# Patient Record
Sex: Female | Born: 1965 | Race: White | Hispanic: No | Marital: Married | Smoking: Never smoker
Health system: Southern US, Community
[De-identification: ages and names within clinical notes are randomized; demographics above are authoritative.]

## PROBLEM LIST (undated history)

## (undated) DIAGNOSIS — G4730 Sleep apnea, unspecified: Secondary | ICD-10-CM

## (undated) DIAGNOSIS — E785 Hyperlipidemia, unspecified: Secondary | ICD-10-CM

## (undated) DIAGNOSIS — L509 Urticaria, unspecified: Principal | ICD-10-CM

## (undated) DIAGNOSIS — K219 Gastro-esophageal reflux disease without esophagitis: Secondary | ICD-10-CM

## (undated) DIAGNOSIS — G43909 Migraine, unspecified, not intractable, without status migrainosus: Secondary | ICD-10-CM

## (undated) HISTORY — DX: Gastro-esophageal reflux disease without esophagitis: K21.9

## (undated) HISTORY — PX: WISDOM TOOTH EXTRACTION: SHX21

## (undated) HISTORY — DX: Hyperlipidemia, unspecified: E78.5

## (undated) HISTORY — DX: Urticaria, unspecified: L50.9

## (undated) HISTORY — DX: Sleep apnea, unspecified: G47.30

---

## 2012-11-01 ENCOUNTER — Encounter (HOSPITAL_COMMUNITY): Payer: Self-pay | Admitting: *Deleted

## 2012-11-01 ENCOUNTER — Emergency Department (HOSPITAL_COMMUNITY)
Admission: EM | Admit: 2012-11-01 | Discharge: 2012-11-01 | Disposition: A | Discharge status: Home / Self Care | Payer: BC Managed Care – PPO | Attending: Emergency Medicine | Admitting: Emergency Medicine

## 2012-11-01 ENCOUNTER — Emergency Department (HOSPITAL_COMMUNITY): Payer: BC Managed Care – PPO

## 2012-11-01 DIAGNOSIS — L52 Erythema nodosum: Secondary | ICD-10-CM | POA: Insufficient documentation

## 2012-11-01 DIAGNOSIS — Z8679 Personal history of other diseases of the circulatory system: Secondary | ICD-10-CM | POA: Insufficient documentation

## 2012-11-01 DIAGNOSIS — R110 Nausea: Secondary | ICD-10-CM | POA: Insufficient documentation

## 2012-11-01 DIAGNOSIS — R5381 Other malaise: Secondary | ICD-10-CM | POA: Insufficient documentation

## 2012-11-01 HISTORY — DX: Migraine, unspecified, not intractable, without status migrainosus: G43.909

## 2012-11-01 LAB — CBC WITH DIFFERENTIAL/PLATELET
Basophils Absolute: 0 10*3/uL (ref 0.0–0.1)
Basophils Relative: 0 % (ref 0–1)
Eosinophils Absolute: 0.2 10*3/uL (ref 0.0–0.7)
Eosinophils Relative: 2 % (ref 0–5)
MCH: 29.9 pg (ref 26.0–34.0)
MCV: 89.6 fL (ref 78.0–100.0)
Platelets: 272 10*3/uL (ref 150–400)
RDW: 12.9 % (ref 11.5–15.5)

## 2012-11-01 MED ORDER — ONDANSETRON HCL 4 MG/2ML IJ SOLN
4.0000 mg | Freq: Once | INTRAMUSCULAR | Status: AC
  Administered 2012-11-01: 4 mg via INTRAVENOUS
  Filled 2012-11-01: qty 2

## 2012-11-01 MED ORDER — METHYLPREDNISOLONE 4 MG PO KIT: PACK | ORAL | Status: DC

## 2012-11-01 MED ORDER — SODIUM CHLORIDE 0.9 % IV BOLUS (SEPSIS)
1000.0000 mL | Freq: Once | INTRAVENOUS | Status: AC
  Administered 2012-11-01: 1000 mL via INTRAVENOUS

## 2012-11-01 MED ORDER — HYDROXYZINE HCL 10 MG PO TABS: 10.0000 mg | ORAL_TABLET | Freq: Three times a day (TID) | ORAL | Status: DC | PRN

## 2012-11-01 MED ORDER — METHYLPREDNISOLONE SODIUM SUCC 125 MG IJ SOLR
125.0000 mg | Freq: Once | INTRAMUSCULAR | Status: AC
  Administered 2012-11-01: 125 mg via INTRAVENOUS
  Filled 2012-11-01: qty 2

## 2012-11-01 MED ORDER — EPINEPHRINE 0.3 MG/0.3ML IJ DEVI: 0.3000 mg | INTRAMUSCULAR | Status: AC | PRN

## 2012-11-01 NOTE — ED Notes (Signed)
Pt reports intermittent rash/hives all over her body since November. Has seen her doctor since and was put on prednisone, last dose was last week Wednesday.  Pt reports she started having rash again after.  Pt also reports sudden onset of nausea and h/a less than an hour ago.  Pt denies any abd pain or diarrhea at this time.  Pt denies known fever at present.

## 2012-11-01 NOTE — ED Provider Notes (Signed)
History     CSN: [id number]  Arrival date & time 11/01/12  1228   First MD Initiated Contact with Patient 11/01/12 1245      Chief Complaint  Patient presents with  . Rash  . Nausea  . Weakness     HPI Pt reports intermittent rash/hives all over her body since November. Has seen her doctor since and was put on prednisone, last dose was last week Wednesday. Pt reports she started having rash again after  At times she states the rash is "painful nodules" under her skin that seem to come and go.. Pt also reports sudden onset of nausea and h/a less than an hour ago. Pt denies any abd pain or diarrhea at this time. Pt denies known fever at present.   Past Medical History  Diagnosis Date  . Migraine     History reviewed. No pertinent past surgical history.  No family history on file.  History  Substance Use Topics  . Smoking status: Never Smoker   . Smokeless tobacco: Not on file  . Alcohol Use: Yes     Comment: occasionally    OB History    Grav Para Term Preterm Abortions TAB SAB Ect Mult Living                  Review of Systems  All other systems reviewed and are negative.    Allergies  Codeine; Hydrocodone; and Penicillins  Home Medications  No current outpatient prescriptions on file.  BP 145/76  Pulse 90  Temp 97.8 F (36.6 C) (Oral)  SpO2 100%  Physical Exam  Nursing note and vitals reviewed. Constitutional: She is oriented to person, place, and time. She appears well-developed and well-nourished. No distress.  HENT:  Head: Normocephalic and atraumatic.  Eyes: Pupils are equal, round, and reactive to light.  Neck: Normal range of motion.  Cardiovascular: Normal rate and intact distal pulses.   Pulmonary/Chest: No respiratory distress.  Abdominal: Normal appearance. She exhibits no distension.  Musculoskeletal: Normal range of motion.  Neurological: She is alert and oriented to person, place, and time. No cranial nerve deficit.  Skin: Skin is  warm and dry. Rash noted. No ecchymosis, no lesion and no petechiae noted. Rash is urticarial.  Psychiatric: She has a normal mood and affect. Her behavior is normal.    ED Course  Procedures (including critical care time  Meds ordered this encounter  Medications  . sodium chloride 0.9 % bolus 1,000 mL    Sig:   . ondansetron (ZOFRAN) injection 4 mg    Sig:   . methylPREDNISolone sodium succinate (SOLU-MEDROL) 125 mg/2 mL injection 125 mg    Sig:      Labs Reviewed  CBC WITH DIFFERENTIAL  SEDIMENTATION RATE   No results found.   Erythema Nodosum    MDM         Nelia Shi, MD 11/01/12 2241

## 2013-06-09 ENCOUNTER — Encounter (INDEPENDENT_AMBULATORY_CARE_PROVIDER_SITE_OTHER): Payer: BC Managed Care – PPO | Admitting: Internal Medicine

## 2013-06-09 ENCOUNTER — Encounter: Payer: Self-pay | Admitting: Internal Medicine

## 2013-06-09 DIAGNOSIS — R413 Other amnesia: Secondary | ICD-10-CM

## 2013-06-09 DIAGNOSIS — R21 Rash and other nonspecific skin eruption: Secondary | ICD-10-CM

## 2013-06-09 NOTE — Progress Notes (Signed)
HPI Patinet is a 47 yo history fo migraines.  Takes Verapamil November rash/hives  Jan went to Dr.  Steroids then come back  Sent to allergy Autoimmune Sent to Derm  Autoimmune May had episodes of tingling throughout body  Leaves her with odd felelng Not associated with activity.  Maybe 30 x total. Last time was Aug.    Short term memory effected.    Still has hives.  Gets lumps below skin  Tender  Last a few days. No vision changes.  Breathing is OK  No wheezing.  No SOB   Denies diarrhea.  Patient denies CP.   Last labs LDL was 123, HDL was 73.  (July 2014)  Allergies  Allergen Reactions  . Codeine Nausea And Vomiting  . Eggs Or Egg-Derived Products   . Hydrocodone Nausea And Vomiting  . Penicillins Rash    Current Outpatient Prescriptions  Medication Sig Dispense Refill  . aspirin EC 81 MG tablet Take 81 mg by mouth every morning.      . Calcium Carbonate-Vit D-Min (GNP CALCIUM 1200) 1200-1000 MG-UNIT CHEW Chew by mouth. 1 TAB DAILY      . diphenhydrAMINE (BENADRYL) 25 mg capsule Take 25 mg by mouth every 6 (six) hours as needed.      Marland Kitchen EPINEPHrine (EPIPEN) 0.3 mg/0.3 mL DEVI Inject 0.3 mLs (0.3 mg total) into the muscle as needed.  2 Device  1  . folic acid (FOLVITE) 800 MCG tablet Take 800 mcg by mouth daily.       . furosemide (LASIX) 20 MG tablet PT RARELY TAKES LASIX MAYBE TWICE A YEAR      . hydrOXYzine (ATARAX/VISTARIL) 10 MG tablet Take 1 tablet (10 mg total) by mouth 3 (three) times daily as needed for itching.  30 tablet  0  . Norethindrone-Ethinyl Estradiol-Fe (GENERESS FE) 0.8-25 MG-MCG tablet Chew 1 tablet by mouth daily.      . Omega-3 Fatty Acids (FISH OIL DOUBLE STRENGTH) 1200 MG CAPS Take 1 capsule by mouth daily.      Marland Kitchen pyridOXINE (B-6) 50 MG tablet Take 50 mg by mouth daily.      . ranitidine (ZANTAC) 150 MG tablet Take 150 mg by mouth 2 (two) times daily.      . SUMAtriptan (IMITREX) 100 MG tablet Take 100 mg by mouth every 2 (two) hours as needed for  migraine.      . verapamil (CALAN-SR) 180 MG CR tablet Take 180 mg by mouth at bedtime. For miragrines      . vitamin B-12 (CYANOCOBALAMIN) 1000 MCG tablet Take 1,000 mcg by mouth daily.       No current facility-administered medications for this visit.    Past Medical History  Diagnosis Date  . Migraine     History reviewed. No pertinent past surgical history.  Family History  Problem Relation Age of Onset  . Hypertension Mother   . Heart disease Mother     BYPASS AND  A ABLATION  . Heart attack Mother   . Stroke Mother   . Heart attack Father   . Heart disease Father     OPEN HEART SURGERY/HEART FAILURE/ PACEMAKER AND DEFIB   . Stroke Father   . Heart disease Sister     HEART ATTACK AGE 31/ HE HAS A STENTX1/ CABG X4 VESSELS AT AGE 23 / AT AGE 35 BRAIN STEM STROKE    History   Social History  . Marital Status: Married    Spouse Name: N/A  Number of Children: N/A  . Years of Education: N/A   Occupational History  . Not on file.   Social History Main Topics  . Smoking status: Never Smoker   . Smokeless tobacco: Not on file  . Alcohol Use: Yes     Comment: occasionally  . Drug Use: No  . Sexual Activity: Not on file   Other Topics Concern  . Not on file   Social History Narrative  . No narrative on file    Review of Systems:  All systems reviewed.  They are negative to the above problem except as previously stated.  Vital Signs: BP 147/93  Pulse 82  Ht 5\' 4"  (1.626 m)  Wt 193 lb (87.544 kg)  BMI 33.11 kg/m2  Physical Exam  HEENT:  Normocephalic, atraumatic. EOMI, PERRLA.  Neck: JVP is normal.  No bruits.  Lungs: clear to auscultation. No rales no wheezes.  Heart: Regular rate and rhythm. Normal S1, S2. No S3.   No significant murmurs. PMI not displaced.  Abdomen:  Supple, nontender. Normal bowel sounds. No masses. No hepatomegaly.  Extremities:   Good distal pulses throughout. No lower extremity edema.  Musculoskeletal :moving all extremities.   Neuro:   alert and oriented x3.  CN II-XII grossly intact.  EKG    SR 82 bpm.   Assessment and Plan:  1.  Immune mediated syndrome.  I am not sure of the cause  Seen by allergy  On antihistamines  Still uncomfortable    Will review who she can see for further eval  2.  SHort ter memory problmes.  I am not sure why having problems  I did not do full evaluation.  Not sure if related to 1  Will review wherer she should seek poss eval  3.  Cardiac risk stratification.  Patient's cholesterol panel is pretty good.  If This encounter was created in error - please disregard.

## 2013-08-04 ENCOUNTER — Other Ambulatory Visit: Payer: Self-pay

## 2014-03-03 ENCOUNTER — Encounter: Payer: Self-pay | Admitting: Internal Medicine

## 2014-03-03 NOTE — Progress Notes (Signed)
This encounter was created in error - please disregard.

## 2014-06-02 ENCOUNTER — Telehealth: Payer: Self-pay | Admitting: Internal Medicine

## 2014-06-02 ENCOUNTER — Encounter: Payer: Self-pay | Admitting: Internal Medicine

## 2014-06-02 DIAGNOSIS — L509 Urticaria, unspecified: Secondary | ICD-10-CM

## 2014-06-02 DIAGNOSIS — K9041 Non-celiac gluten sensitivity: Secondary | ICD-10-CM

## 2014-06-02 HISTORY — DX: Urticaria, unspecified: L50.9

## 2014-06-02 NOTE — Telephone Encounter (Signed)
Chronic intermittent rash better gluten free.  She has discussed w/ me and back on gluten. Rash returning.  After she has been on gluten x 2 weeks + will check serology.

## 2014-06-07 ENCOUNTER — Other Ambulatory Visit (INDEPENDENT_AMBULATORY_CARE_PROVIDER_SITE_OTHER): Payer: BC Managed Care – PPO

## 2014-06-07 DIAGNOSIS — K900 Celiac disease: Secondary | ICD-10-CM

## 2014-06-07 DIAGNOSIS — K9041 Non-celiac gluten sensitivity: Secondary | ICD-10-CM

## 2014-06-08 LAB — IGA: IGA: 158 mg/dL (ref 68–378)

## 2014-06-08 LAB — TISSUE TRANSGLUTAMINASE, IGA: TISSUE TRANSGLUTAMINASE AB, IGA: 7.4 U/mL (ref <20)

## 2014-11-02 DIAGNOSIS — R52 Pain, unspecified: Secondary | ICD-10-CM

## 2015-04-16 ENCOUNTER — Ambulatory Visit (AMBULATORY_SURGERY_CENTER): Payer: Self-pay | Admitting: *Deleted

## 2015-04-16 VITALS — Ht 64.5 in | Wt 188.0 lb

## 2015-04-16 DIAGNOSIS — Z1211 Encounter for screening for malignant neoplasm of colon: Secondary | ICD-10-CM

## 2015-04-16 DIAGNOSIS — K219 Gastro-esophageal reflux disease without esophagitis: Secondary | ICD-10-CM

## 2015-04-16 MED ORDER — NA SULFATE-K SULFATE-MG SULF 17.5-3.13-1.6 GM/177ML PO SOLN
ORAL | Status: DC
Start: 2015-04-16 — End: 2015-04-23

## 2015-04-16 NOTE — Progress Notes (Signed)
Patient denies any allergies to soy. Allergy to eggs per patient. Patient denies any past surgeries. Patient denies any oxygen use at home and does not take any diet/weight loss medications. EMMI education assisgned to patient on colonoscopy, this was explained and instructions given to patient.

## 2015-04-23 ENCOUNTER — Ambulatory Visit (AMBULATORY_SURGERY_CENTER): Payer: BLUE CROSS/BLUE SHIELD | Admitting: Internal Medicine

## 2015-04-23 ENCOUNTER — Encounter: Payer: Self-pay | Admitting: Internal Medicine

## 2015-04-23 VITALS — BP 140/72 | HR 79 | Temp 98.6°F | Resp 15 | Ht 64.5 in | Wt 188.0 lb

## 2015-04-23 DIAGNOSIS — Z1211 Encounter for screening for malignant neoplasm of colon: Secondary | ICD-10-CM | POA: Diagnosis not present

## 2015-04-23 DIAGNOSIS — K219 Gastro-esophageal reflux disease without esophagitis: Secondary | ICD-10-CM

## 2015-04-23 MED ORDER — SODIUM CHLORIDE 0.9 % IV SOLN: 500.0000 mL | INTRAVENOUS | Status: DC

## 2015-04-23 NOTE — Progress Notes (Signed)
Report to PACU, RN, vss, BBS= Clear.  

## 2015-04-23 NOTE — Op Note (Signed)
[place]  [address]. [place], [id number]   COLONOSCOPY PROCEDURE REPORT  PATIENT: Lisa Humphrey, Lisa Humphrey  MR#: [id number] BIRTHDATE: Jul 29, 1966 , 49  yrs. old GENDER: female ENDOSCOPIST: Jerene Bears, MD PROCEDURE DATE:  04/23/2015 PROCEDURE:   Colonoscopy, screening First Screening Colonoscopy - Avg.  risk and is 50 yrs.  old or older Yes.  Prior Negative Screening - Now for repeat screening. N/A  History of Adenoma - Now for follow-up colonoscopy & has been > or = to 3 yrs.  N/A  Polyps removed today? No Recommend repeat exam, <10 yrs? No ASA CLASS:   Class II INDICATIONS:Screening for colonic neoplasia. MEDICATIONS: Monitored anesthesia care, Propofol 500 mg IV, and this was the total dose used for all procedures at this session  DESCRIPTION OF PROCEDURE:   After the risks benefits and alternatives of the procedure were thoroughly explained, informed consent was obtained.  The digital rectal exam revealed no rectal mass.   The LB [id number]  endoscope was introduced through the anus and advanced to the terminal ileum which was intubated for a short distance. No adverse events experienced.   The quality of the prep was excellent.  (Suprep was used)  The instrument was then slowly withdrawn as the colon was fully examined. Estimated blood loss is zero unless otherwise noted in this procedure report.      COLON FINDINGS: The examined terminal ileum appeared to be normal. A normal appearing cecum, ileocecal valve, and appendiceal orifice were identified.  The ascending, transverse, descending, sigmoid colon, and rectum appeared unremarkable.  Retroflexed views revealed no abnormalities. The time to cecum = 3.2 Withdrawal time = 12.1   The scope was withdrawn and the procedure completed.  COMPLICATIONS: There were no immediate complications.  ENDOSCOPIC IMPRESSION: 1.   The examined terminal ileum appeared to be normal 2.   Normal  colonoscopy  RECOMMENDATIONS: Repeat colonoscopy 10 years.  eSigned:  Jerene Bears, MD 04/23/2015 3:02 PM   cc:  the patient, Margaretha Sheffield, MD

## 2015-04-23 NOTE — Patient Instructions (Signed)
Discharge instructions given. Normal exam. Handout on a hiatal hernia. Resume previous medications. YOU HAD AN ENDOSCOPIC PROCEDURE TODAY AT [facility]:   Refer to the procedure report that was given to you for any specific questions about what was found during the examination.  If the procedure report does not answer your questions, please call your gastroenterologist to clarify.  If you requested that your care partner not be given the details of your procedure findings, then the procedure report has been included in a sealed envelope for you to review at your convenience later.  YOU SHOULD EXPECT: Some feelings of bloating in the abdomen. Passage of more gas than usual.  Walking can help get rid of the air that was put into your GI tract during the procedure and reduce the bloating. If you had a lower endoscopy (such as a colonoscopy or flexible sigmoidoscopy) you may notice spotting of blood in your stool or on the toilet paper. If you underwent a bowel prep for your procedure, you may not have a normal bowel movement for a few days.  Please Note:  You might notice some irritation and congestion in your nose or some drainage.  This is from the oxygen used during your procedure.  There is no need for concern and it should clear up in a day or so.  SYMPTOMS TO REPORT IMMEDIATELY:   Following lower endoscopy (colonoscopy or flexible sigmoidoscopy):  Excessive amounts of blood in the stool  Significant tenderness or worsening of abdominal pains  Swelling of the abdomen that is new, acute  Fever of 100F or higher   Following upper endoscopy (EGD)  Vomiting of blood or coffee ground material  New chest pain or pain under the shoulder blades  Painful or persistently difficult swallowing  New shortness of breath  Fever of 100F or higher  Black, tarry-looking stools  For urgent or emergent issues, a gastroenterologist can be reached at any hour by calling [phone]  [phone].   DIET: Your first meal following the procedure should be a small meal and then it is ok to progress to your normal diet. Heavy or fried foods are harder to digest and may make you feel nauseous or bloated.  Likewise, meals heavy in dairy and vegetables can increase bloating.  Drink plenty of fluids but you should avoid alcoholic beverages for 24 hours.  ACTIVITY:  You should plan to take it easy for the rest of today and you should NOT DRIVE or use heavy machinery until tomorrow (because of the sedation medicines used during the test).    FOLLOW UP: Our staff will call the number listed on your records the next business day following your procedure to check on you and address any questions or concerns that you may have regarding the information given to you following your procedure. If we do not reach you, we will leave a message.  However, if you are feeling well and you are not experiencing any problems, there is no need to return our call.  We will assume that you have returned to your regular daily activities without incident.  If any biopsies were taken you will be contacted by phone or by letter within the next 1-3 weeks.  Please call us at [phone] if you have not heard about the biopsies in 3 weeks.    SIGNATURES/CONFIDENTIALITY: You and/or your care partner have signed paperwork which will be entered into your electronic medical record.  These signatures attest to the fact  that that the information above on your After Visit Summary has been reviewed and is understood.  Full responsibility of the confidentiality of this discharge information lies with you and/or your care-partner.

## 2015-04-23 NOTE — Op Note (Signed)
[place]  [address]. [place], [id number]   ENDOSCOPY PROCEDURE REPORT  PATIENT: Lisa, Humphrey  MR#: [id number] BIRTHDATE: 1966/05/19 , 49  yrs. old GENDER: female ENDOSCOPIST: Jerene Bears, MD PROCEDURE DATE:  04/23/2015 PROCEDURE:  EGD, diagnostic ASA CLASS:     Class II INDICATIONS:  history of GERD. MEDICATIONS: Monitored anesthesia care and Propofol 300 mg IV TOPICAL ANESTHETIC: none  DESCRIPTION OF PROCEDURE: After the risks benefits and alternatives of the procedure were thoroughly explained, informed consent was obtained.  The LB [id number] endoscope was introduced through the mouth and advanced to the second portion of the duodenum , Without limitations.  The instrument was slowly withdrawn as the mucosa was fully examined.   ESOPHAGUS: The mucosa of the esophagus appeared normal.   Z-line regular at 38 cm.  STOMACH: A 2 cm, sliding type, hiatal hernia was noted.   The mucosa of the stomach appeared normal.  DUODENUM: The duodenal mucosa showed no abnormalities in the bulb and 2nd part of the duodenum.  Retroflexed views revealed a small hiatal hernia.     The scope was then withdrawn from the patient and the procedure completed.  COMPLICATIONS: There were no immediate complications.  ENDOSCOPIC IMPRESSION: 1.   The mucosa of the esophagus appeared normal 2.   2 cm hiatal hernia 3.   The mucosa of the stomach appeared normal 4.   The duodenal mucosa showed no abnormalities in the bulb and 2nd part of the duodenum  RECOMMENDATIONS: 1.  Continue current medications  eSigned:  Jerene Bears, MD 04/23/2015 2:59 PM    CC: the patient, Margaretha Sheffield, MD

## 2015-04-24 ENCOUNTER — Telehealth: Payer: Self-pay | Admitting: *Deleted

## 2015-04-24 NOTE — Telephone Encounter (Signed)
  Follow up Call-  Call back number 04/23/2015  Post procedure Call Back phone  # [phone]  Permission to leave phone message Yes     Patient questions:  Do you have a fever, pain , or abdominal swelling? No. Pain Score  0 *  Have you tolerated food without any problems? Yes.    Have you been able to return to your normal activities? Yes.    Do you have any questions about your discharge instructions: Diet   No. Medications  No. Follow up visit  No  Do you have questions or concerns about your Care? No.  Actions: * If pain score is 4 or above: No action needed, pain <4.

## 2016-06-06 ENCOUNTER — Ambulatory Visit: Payer: BLUE CROSS/BLUE SHIELD | Admitting: Family

## 2016-06-09 NOTE — Progress Notes (Signed)
[facility] [address] [name], [place] [address] Phone: [phone] Subjective:    I'm seeing this patient by the request  of:  POMPOSINI,DANIEL L, MD   CC: Left heel pain   [id number]  Lisa Humphrey is a 50 y.o. female coming in with complaint of left heel pain .  Left heel pain has been going on multiple months. Patient does not remember any injury. Patient states approximately 1 year ago she was having plantar fasciitis. Patient states that this has changed. Patient states now it seems to be worse with activity.    Past Medical History:  Diagnosis Date  . GERD (gastroesophageal reflux disease)   . Migraine   . Sleep apnea    CPAP  . Urticarial rash 06/02/2014   Past Surgical History:  Procedure Laterality Date  . WISDOM TOOTH EXTRACTION     Social History   Social History  . Marital status: Married    Spouse name: N/A  . Number of children: N/A  . Years of education: N/A   Social History Main Topics  . Smoking status: Never Smoker  . Smokeless tobacco: Never Used  . Alcohol use Yes     Comment: occasionally  . Drug use: No  . Sexual activity: Not Asked   Other Topics Concern  . None   Social History Narrative  . None   Allergies  Allergen Reactions  . Codeine Nausea And Vomiting  . Eggs Or Egg-Derived Products Other (See Comments)    "Allergy test was positive years ago, I eat eggs" per pt.  . Hydrocodone Nausea And Vomiting  . Penicillins Rash   Family History  Problem Relation Age of Onset  . Hypertension Mother   . Heart disease Mother     BYPASS AND  A ABLATION  . Heart attack Mother   . Stroke Mother   . Colon polyps Mother   . Diverticulosis Mother   . Heart attack Father   . Heart disease Father     OPEN HEART SURGERY/HEART FAILURE/ PACEMAKER AND DEFIB   . Stroke Father   . Colon polyps Father   . Diverticulosis Father   . Hiatal hernia Father   . Heart disease Sister     HEART ATTACK AGE 82/ HE HAS  A STENTX1/ CABG X4 VESSELS AT AGE 35 / AT AGE 42 BRAIN STEM STROKE  . Colon cancer Neg Hx   . Esophageal cancer Neg Hx   . Rectal cancer Neg Hx   . Stomach cancer Neg Hx   . Colon polyps Brother     Past medical history, social, surgical and family history all reviewed in electronic medical record.  No pertanent information unless stated regarding to the chief complaint.   Review of Systems: No headache, visual changes, nausea, vomiting, diarrhea, constipation, dizziness, abdominal pain, skin rash, fevers, chills, night sweats, weight loss, swollen lymph nodes, body aches, joint swelling, muscle aches, chest pain, shortness of breath, mood changes.   Objective  Blood pressure 134/78, pulse 76, weight 182 lb (82.6 kg), SpO2 98 %.  General: No apparent distress alert and oriented x3 mood and affect normal, dressed appropriately.  HEENT: Pupils equal, extraocular movements intact  Respiratory: Patient's speak in full sentences and does not appear short of breath  Cardiovascular: No lower extremity edema, non tender, no erythema  Skin: Warm dry intact with no signs of infection or rash on extremities or on axial skeleton.  Abdomen: Soft nontender  Neuro: Cranial  nerves II through XII are intact, neurovascularly intact in all extremities with 2+ DTRs and 2+ pulses.  Lymph: No lymphadenopathy of posterior or anterior cervical chain or axillae bilaterally.  Gait Mild antalgic gait MSK:  Non tender with full range of motion and good stability and symmetric strength and tone of shoulders, elbows, wrist, hip, knee bilaterally.   Ankle: Left No visible erythema or swelling. Range of motion is full in all directions. Strength is 5/5 in all directions. Stable lateral and medial ligaments; squeeze test and kleiger test unremarkable; Talar dome nontender; No pain at base of 5th MT; No tenderness over cuboid; No tenderness over N spot or navicular prominence No tenderness on posterior aspects of  lateral and medial malleolus No sign of peroneal tendon subluxations or tenderness to palpation Negative tarsal tunnel tinel's Able to walk 4 steps does have antalgic gait Patient has tenderness to palpation on the plantar aspect of the heel. Patient also has some mild pain on the plantar aspect of the midfoot. Otherwise unremarkable.  MSK US performed of: Left foot and ankle This study was ordered, performed, and interpreted by Charlann Boxer D.O.  Foot/Ankle:   Plantar aspect of the foot was visualized. Patient has what appears to be calcific changes at the insertion or origin of the plantar fascia. Likely second due to scar tissue formation. Patient does have some mild intersubstance tearing of the plantar fascia. 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      Note: This dictation was prepared with Dragon dictation along with smaller phrase technology. Any transcriptional errors that result from this process are unintentional.

## 2016-06-10 ENCOUNTER — Encounter: Payer: Self-pay | Admitting: Family Medicine

## 2016-06-10 ENCOUNTER — Ambulatory Visit (INDEPENDENT_AMBULATORY_CARE_PROVIDER_SITE_OTHER): Payer: BLUE CROSS/BLUE SHIELD | Admitting: Family Medicine

## 2016-06-10 ENCOUNTER — Other Ambulatory Visit: Payer: Self-pay

## 2016-06-10 VITALS — BP 134/78 | HR 76 | Wt 182.0 lb

## 2016-06-10 DIAGNOSIS — M79672 Pain in left foot: Secondary | ICD-10-CM

## 2016-06-10 DIAGNOSIS — D2122 Benign neoplasm of connective and other soft tissue of left lower limb, including hip: Secondary | ICD-10-CM

## 2016-06-10 MED ORDER — MELOXICAM 15 MG PO TABS: 15.0000 mg | ORAL_TABLET | Freq: Every day | ORAL | 0 refills | Status: DC

## 2016-06-10 MED ORDER — VITAMIN D (ERGOCALCIFEROL) 1.25 MG (50000 UNIT) PO CAPS: 50000.0000 [IU] | ORAL_CAPSULE | ORAL | 0 refills | Status: DC

## 2016-06-10 NOTE — Patient Instructions (Addendum)
Good to see you  Ice 20 minutes 2 times daily. Usually after activity and before bed. Wear boot with walking.  Meloxicam daily for 30 days stop if it hurts your stomach  Once weekly vitamin D for next 12 weeks.  Come out of boot and move it a couple times a day not weight bearing You have a tear of the plantar fascia with calcific changes and then a fibroma See me again in 4 weeks and we will then discuss shoes.

## 2016-06-10 NOTE — Assessment & Plan Note (Signed)
Patient does have some calcific deposits at the origin of the plantar fascia. In addition of this toe patient does have a fibroma. This is the area where patient seems to be the most tender. Discussed with patient at great length. Patient will try once weekly vitamin D, Cam Walker, icing protocol. Patient given oral anti-inflammatories ankle be beneficial. We will see how patient response to conservative therapy. Follow-up in 4 weeks to make sure that it could be decreasing in size. If worsening symptoms or no improvement we can consider injection.

## 2016-06-13 ENCOUNTER — Ambulatory Visit: Payer: BLUE CROSS/BLUE SHIELD | Admitting: Family

## 2016-06-24 ENCOUNTER — Ambulatory Visit: Payer: BLUE CROSS/BLUE SHIELD | Admitting: Family Medicine

## 2016-07-06 ENCOUNTER — Other Ambulatory Visit: Payer: Self-pay | Admitting: Family Medicine

## 2016-07-07 NOTE — Telephone Encounter (Signed)
Refill done.  

## 2016-07-08 NOTE — Progress Notes (Signed)
[name] [place] [place], [place] [address] Phone: [phone] Subjective:    I'm seeing this patient by the request  of:  POMPOSINI,DANIEL L, MD   CC: Left heel pain  F/u  [id number]  Lisa Humphrey is a 50 y.o. female coming in with complaint of left heel pain .  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 Seen 4 weeks ago. Patient was to start vitamin D, Cam Walker as well as an icing protocol. Given oral anti-inflammatories. Patient states Mild improvement. States that she continues to have difficulty. Patient states that if she comes out of the [employer] she has worsening pain. Especially if she puts pressure on the heel itself.    Past Medical History:  Diagnosis Date  . GERD (gastroesophageal reflux disease)   . Migraine   . Sleep apnea    CPAP  . Urticarial rash 06/02/2014   Past Surgical History:  Procedure Laterality Date  . WISDOM TOOTH EXTRACTION     Social History   Social History  . Marital status: Married    Spouse name: N/A  . Number of children: N/A  . Years of education: N/A   Social History Main Topics  . Smoking status: Never Smoker  . Smokeless tobacco: Never Used  . Alcohol use Yes     Comment: occasionally  . Drug use: No  . Sexual activity: Not Asked   Other Topics Concern  . None   Social History Narrative  . None   Allergies  Allergen Reactions  . Codeine Nausea And Vomiting  . Eggs Or Egg-Derived Products Other (See Comments)    "Allergy test was positive years ago, I eat eggs" per pt.  . Hydrocodone Nausea And Vomiting  . Penicillins Rash   Family History  Problem Relation Age of Onset  . Hypertension Mother   . Heart disease Mother     BYPASS AND  A ABLATION  . Heart attack Mother   . Stroke Mother   . Colon polyps Mother   . Diverticulosis Mother   . Heart attack Father   . Heart disease Father     OPEN HEART SURGERY/HEART FAILURE/ PACEMAKER AND DEFIB   .  Stroke Father   . Colon polyps Father   . Diverticulosis Father   . Hiatal hernia Father   . Heart disease Sister     HEART ATTACK AGE 40/ HE HAS A STENTX1/ CABG X4 VESSELS AT AGE 48 / AT AGE 46 BRAIN STEM STROKE  . Colon cancer Neg Hx   . Esophageal cancer Neg Hx   . Rectal cancer Neg Hx   . Stomach cancer Neg Hx   . Colon polyps Brother     Past medical history, social, surgical and family history all reviewed in electronic medical record.  No pertanent information unless stated regarding to the chief complaint.   Review of Systems: No headache, visual changes, nausea, vomiting, diarrhea, constipation, dizziness, abdominal pain, skin rash, fevers, chills, night sweats, weight loss, swollen lymph nodes, body aches, joint swelling, muscle aches, chest pain, shortness of breath, mood changes.   Objective  Blood pressure 122/80, pulse 88, SpO2 98 %.  General: No apparent distress alert and oriented x3 mood and affect normal, dressed appropriately.  HEENT: Pupils equal, extraocular movements intact  Respiratory: Patient's speak in full sentences and does not appear short of breath  Cardiovascular: No lower extremity edema, non tender, no erythema  Skin: Warm dry intact with no signs of infection or rash on extremities or on axial skeleton.  Abdomen: Soft nontender  Neuro: Cranial nerves II through XII are intact, neurovascularly intact in all extremities with 2+ DTRs and 2+ pulses.  Lymph: No lymphadenopathy of posterior or anterior cervical chain or axillae bilaterally.  Gait Mild antalgic gait Still present. Favoring The left heel MSK:  Non tender with full range of motion and good stability and symmetric strength and tone of shoulders, elbows, wrist, hip, knee bilaterally.   Ankle: Left No visible erythema or swelling. Range of motion is full in all directions. Strength is 5/5 in all directions. Stable lateral and medial ligaments; squeeze test and kleiger test unremarkable; Talar  dome nontender; No pain at base of 5th MT; No tenderness over cuboid; No tenderness over N spot or navicular prominence No tenderness on posterior aspects of lateral and medial malleolus No sign of peroneal tendon subluxations or tenderness to palpation Negative tarsal tunnel tinel's Able to walk 4 steps does have antalgic gait Patient has tenderness to palpation on the plantar aspect of the heel. Patient also has some mild pain on the plantar aspect of the midfoot. Otherwise unremarkable.  MSK US performed of: Left foot and ankle This study was ordered, performed, and interpreted by Charlann Boxer D.O.  Foot/Ankle:   Plantar aspect of the foot was visualized. Plantar fascial has significant decrease in the calcifications but does have some intersubstance tearing still noted. Fibroma still noted    IMPRESSION:  Plantar fasciitis tear with decrease in calcific changes and continued fibroma  Procedure: Real-time Ultrasound Guided Injection of fibroma Device: GE Logiq E  Ultrasound guided injection is preferred based studies that show increased duration, increased effect, greater accuracy, decreased procedural pain, increased response rate, and decreased cost with ultrasound guided versus blind injection.  Verbal informed consent obtained.  Time-out conducted.  Noted no overlying erythema, induration, or other signs of local infection.  Skin prepped in a sterile fashion.  Local anesthesia: Topical Ethyl chloride.  With sterile technique and under real time ultrasound guidance:  With a 25-gauge 1 inch needle patient was injected with a total of 0.5 mL of 0.5% Marcaine and 0.5 mL of Kenalog 40 mg/dL. Completed without difficulty  Pain immediately resolved suggesting accurate placement of the medication.  Advised to call if fevers/chills, erythema, induration, drainage, or persistent bleeding.  Images permanently stored and available for review in the ultrasound unit.  Impression: Technically  successful ultrasound guided injection.    Impression and Recommendations:     This case required medical decision making of moderate complexity.      Note: This dictation was prepared with Dragon dictation along with smaller phrase technology. Any transcriptional errors that result from this process are unintentional.

## 2016-07-09 ENCOUNTER — Other Ambulatory Visit: Payer: Self-pay

## 2016-07-09 ENCOUNTER — Encounter: Payer: Self-pay | Admitting: Family Medicine

## 2016-07-09 ENCOUNTER — Ambulatory Visit (INDEPENDENT_AMBULATORY_CARE_PROVIDER_SITE_OTHER): Payer: BLUE CROSS/BLUE SHIELD | Admitting: Family Medicine

## 2016-07-09 VITALS — BP 122/80 | HR 88

## 2016-07-09 DIAGNOSIS — D2122 Benign neoplasm of connective and other soft tissue of left lower limb, including hip: Secondary | ICD-10-CM

## 2016-07-09 DIAGNOSIS — M79672 Pain in left foot: Secondary | ICD-10-CM

## 2016-07-09 NOTE — Assessment & Plan Note (Signed)
No improvement to worsening symptoms. Tolerated the injection well. Patient did have some improvement in pain. Continue Cam Walker for one more week. We discussed icing regimen, will be fitted for custom orthotics, discussed shoes again in greater detail. Follow-up again in 4 weeks otherwise.

## 2016-07-09 NOTE — Patient Instructions (Signed)
Good to see you  We injected fibroma.  Ice is your friend Refilled the meloxicam  We will call you when we get the orthotic in.  If it starts to feel better come out of the boot in 1 week.  See me again in 4 weeks.

## 2016-07-14 ENCOUNTER — Encounter: Payer: Self-pay | Admitting: Family Medicine

## 2016-07-14 ENCOUNTER — Ambulatory Visit (INDEPENDENT_AMBULATORY_CARE_PROVIDER_SITE_OTHER): Payer: BLUE CROSS/BLUE SHIELD | Admitting: Family Medicine

## 2016-07-14 DIAGNOSIS — D2122 Benign neoplasm of connective and other soft tissue of left lower limb, including hip: Secondary | ICD-10-CM

## 2016-07-14 NOTE — Patient Instructions (Signed)
Good to see you Increase the wear of the orthotic slowly.  Start 2 hours first day and increase 1 hour a day until a full day.  Can take up to 3 weeks to be comfortable with them.  See Dr. Tamala Julian again in 3-4 weeks.

## 2016-07-14 NOTE — Assessment & Plan Note (Signed)
Patient was fitted in orthotics today. We discussed slowly increasing wear over the course of time. We discussed what adjustments may be need to be needed. Patient will call if has any difficulty. Follow-up again in 2-4 weeks.

## 2016-07-14 NOTE — Progress Notes (Signed)
  [name] [address] [place], [place] [address] Phone: [phone] Subjective:    I'm seeing this patient by the request  of:  POMPOSINI,DANIEL L, MD   CC: Left heel pain  F/u  [id number]  Gertis Sory is a 51 y.o. female coming in with complaint of left heel pain .  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  Given an injection in the fibroma itself. Patient has been having difficulty even do feel that patient needs custom orthotics. Patient is here to be fitted today.    Procedure Note   Patient was fitted for a : standard, cushioned, semi-rigid orthotic. The orthotic was heated and afterward the patient patient seated position and molded The patient was positioned in subtalar neutral position and 10 degrees of ankle dorsiflexion in a weight bearing stance. After completion of molding, patient did have orthotic management The blank was ground to a stable position for weight bearing. Size: 8.5 Base: Carbon fiber Additional Posting and Padding: Patient is longitudinal arch bilaterally of the 300/100 The patient ambulated these, and they were very comfortable. I spent 45 minutes with this patient, greater than 50% was face-to-face time counseling regarding the below diagnosis.    Impression and Recommendations:     This case required medical decision making of moderate complexity.      Note: This dictation was prepared with Dragon dictation along with smaller phrase technology. Any transcriptional errors that result from this process are unintentional.

## 2016-07-17 ENCOUNTER — Other Ambulatory Visit: Payer: BLUE CROSS/BLUE SHIELD

## 2016-07-17 DIAGNOSIS — M79672 Pain in left foot: Secondary | ICD-10-CM

## 2016-08-05 NOTE — Progress Notes (Signed)
[name] [place], [place] [address] Phone: [phone] Subjective:      CC: Left heel pain  F/u  [id number]  Lisa Humphrey is a 50 y.o. female coming in with complaint of left heel pain .  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  Given an injection in the fibroma itself. Also having plantar fasciitis. Patient was putting custom orthotics. Patient states She is doing much better. Feels that the orthotics at the very helpful. Still has some mild soreness of the first steps in the morning and at the end of a long day but nothing as severe as what it was. Mild numbness noted on the lateral aspect of the foot. Patient states that the ball of her foot and is no longer having the pain though.   Past Medical History:  Diagnosis Date  . GERD (gastroesophageal reflux disease)   . Migraine   . Sleep apnea    CPAP  . Urticarial rash 06/02/2014   Past Surgical History:  Procedure Laterality Date  . WISDOM TOOTH EXTRACTION     Social History  Substance Use Topics  . Smoking status: Never Smoker  . Smokeless tobacco: Never Used  . Alcohol use Yes     Comment: occasionally   Allergies  Allergen Reactions  . Codeine Nausea And Vomiting  . Eggs Or Egg-Derived Products Other (See Comments)    "Allergy test was positive years ago, I eat eggs" per pt.  . Hydrocodone Nausea And Vomiting  . Penicillins Rash   Family History  Problem Relation Age of Onset  . Hypertension Mother   . Heart disease Mother     BYPASS AND  A ABLATION  . Heart attack Mother   . Stroke Mother   . Colon polyps Mother   . Diverticulosis Mother   . Heart attack Father   . Heart disease Father     OPEN HEART SURGERY/HEART FAILURE/ PACEMAKER AND DEFIB   . Stroke Father   . Colon polyps Father   . Diverticulosis Father   . Hiatal hernia Father   . Heart disease Sister     HEART ATTACK AGE 51/ HE HAS A STENTX1/ CABG X4 VESSELS AT AGE 44 /  AT AGE 65 BRAIN STEM STROKE  . Colon cancer Neg Hx   . Esophageal cancer Neg Hx   . Rectal cancer Neg Hx   . Stomach cancer Neg Hx   . Colon polyps Brother    Blood pressure (!) 150/80, pulse 84, height 5\' 4"  (1.626 m), weight 186 lb 9.6 oz (84.6 kg).   Systems examined below as of 08/06/16 General: NAD A&O x3 mood, affect normal  HEENT: Pupils equal, extraocular movements intact no nystagmus Respiratory: not short of breath at rest or with speaking Cardiovascular: No lower extremity edema, non tender Skin: Warm dry intact with no signs of infection or rash on extremities or on axial skeleton. Abdomen: Soft nontender, no masses Neuro: Cranial nerves  intact, neurovascularly intact in all extremities with 2+ DTRs and 2+ pulses. Lymph: No lymphadenopathy appreciated today  Gait normal with good balance and coordination.  MSK: Non tender with full range of motion and good stability and symmetric strength and tone of shoulders, elbows, wrist,  knee hips and ankles bilaterally.  Foot exam shows patient is some mild overpronation of the hindfoot. Mild breakdown of the transverse and longitudinal arch. Mild discomfort at the medial calcaneal region as  well as the lateral calcaneal region.      Impression and Recommendations:     This case required medical decision making of moderate complexity.      Note: This dictation was prepared with Dragon dictation along with smaller phrase technology. Any transcriptional errors that result from this process are unintentional.

## 2016-08-06 ENCOUNTER — Encounter: Payer: Self-pay | Admitting: Family Medicine

## 2016-08-06 ENCOUNTER — Ambulatory Visit (INDEPENDENT_AMBULATORY_CARE_PROVIDER_SITE_OTHER): Payer: BLUE CROSS/BLUE SHIELD | Admitting: Family Medicine

## 2016-08-06 DIAGNOSIS — D2122 Benign neoplasm of connective and other soft tissue of left lower limb, including hip: Secondary | ICD-10-CM | POA: Diagnosis not present

## 2016-08-06 NOTE — Patient Instructions (Addendum)
Good to see you  I am so glad the orthotics are working.  You are walking great  The fibroma from time to time will get a little angry but I really hope with the orthotics this should take years to get better.  Unfortunately I would avoid being barefoot.  Continue the turmeric as needed  Vitamin D 4000 IU daily for 2 weeks then 2000 IU daily thereafter See me again before the end of the year and we can inject if we need.

## 2016-08-06 NOTE — Assessment & Plan Note (Signed)
Much improved after the injection. Patient is doing home exercises, encouraged patient continue with vitamin D supplementation. Patient will do custom orthotics daily. We discussed which activities to regularly. Follow-up again in 6 weeks.

## 2016-08-07 ENCOUNTER — Other Ambulatory Visit: Payer: Self-pay | Admitting: Family Medicine

## 2016-08-07 NOTE — Telephone Encounter (Signed)
Refill done.  

## 2016-09-24 NOTE — Progress Notes (Signed)
Lisa Humphrey, Lisa Humphrey Subjective:      CC: Left heel pain  F/u  New problem neck pain [id number]  Lisa Humphrey is a 50 y.o. female coming in with complaint of left heel pain .  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  Patient is doing much better with custom orthotics and was given an injection greater than 3 months ago. Patient states still doing very well. No significant pain at all. Patient was having a new prone. Neck pain. States that it seems to be on the right side. Mild dull, throbbing aching pain radiating towards the right shoulder. Denies any weakness. Can wake her up at night. Does not remember any true injury. Has had some audible popping recently. States that the popping feels better. Still able to do daily activities but states that it seems to be more painful and taking anti-inflammatories on a more regular basis.   Past Medical History:  Diagnosis Date  . GERD (gastroesophageal reflux disease)   . Migraine   . Sleep apnea    CPAP  . Urticarial rash 06/02/2014   Past Surgical History:  Procedure Laterality Date  . WISDOM TOOTH EXTRACTION     Social History  Substance Use Topics  . Smoking status: Never Smoker  . Smokeless tobacco: Never Used  . Alcohol use Yes     Comment: occasionally   Allergies  Allergen Reactions  . Codeine Nausea And Vomiting  . Eggs Or Egg-Derived Products Other (See Comments)    "Allergy test was positive years ago, I eat eggs" per pt.  . Hydrocodone Nausea And Vomiting  . Penicillins Rash   Family History  Problem Relation Age of Onset  . Hypertension Mother   . Heart disease Mother     BYPASS AND  A ABLATION  . Heart attack Mother   . Stroke Mother   . Colon polyps Mother   . Diverticulosis Mother   . Heart attack Father   . Heart disease Father     OPEN HEART SURGERY/HEART FAILURE/ PACEMAKER AND DEFIB   . Stroke  Father   . Colon polyps Father   . Diverticulosis Father   . Hiatal hernia Father   . Heart disease Sister     HEART ATTACK AGE 1/ HE HAS A STENTX1/ CABG X4 VESSELS AT AGE 92 / AT AGE 57 BRAIN STEM STROKE  . Colon cancer Neg Hx   . Esophageal cancer Neg Hx   . Rectal cancer Neg Hx   . Stomach cancer Neg Hx   . Colon polyps Brother     Review of Systems: No headache, visual changes, nausea, vomiting, diarrhea, constipation, dizziness, abdominal pain, skin rash, fevers, chills, night sweats, weight loss, swollen lymph nodes, body aches, joint swelling, muscle aches, chest pain, shortness of breath, mood changes.     Blood pressure 130/80, pulse 84, SpO2 96 %.  Systems examined below as of 09/25/16 General: NAD A&O x3 mood, affect normal  HEENT: Pupils equal, extraocular movements intact no nystagmus Respiratory: not short of breath at rest or with speaking Cardiovascular: No lower extremity edema, non tender Skin: Warm dry intact with no signs of infection or rash on extremities or on axial skeleton. Abdomen: Soft nontender, no masses Neuro: Cranial nerves  intact, neurovascularly intact in all extremities with 2+ DTRs and 2+ pulses. Lymph: No lymphadenopathy appreciated today  Gait normal with good  balance and coordination.  MSK: Non tender with full range of motion and good stability and symmetric strength and tone of shoulders, elbows, wrist,  knee hips and ankles bilaterally.   Foot exam shows patient is some mild overpronation of the hindfoot. Mild breakdown of the transverse and longitudinal arch. No tenderness on exam today.  Neck: Inspection unremarkable. No palpable stepoffs. Negative Spurling's maneuver. Tenderness noted on the right side lacking the last 5 of side bending than the last 5 of left-sided rotation Grip strength and sensation normal in bilateral hands Strength good C4 to T1 distribution No sensory change to C4 to T1 Negative Hoffman sign  bilaterally Reflexes normal  Osteopathic findings Cervical C2 flexed rotated and side bent right C4 flexed rotated and side bent left T3 extended rotated and side bent right inhaled third rib T9 extended rotated and side bent left     Impression and Recommendations:     This case required medical decision making of moderate complexity.      Note: This dictation was prepared with Dragon dictation along with smaller phrase technology. Any transcriptional errors that result from this process are unintentional.

## 2016-09-25 ENCOUNTER — Ambulatory Visit (INDEPENDENT_AMBULATORY_CARE_PROVIDER_SITE_OTHER)
Admission: RE | Admit: 2016-09-25 | Discharge: 2016-09-25 | Disposition: A | Discharge status: Home / Self Care | Payer: BLUE CROSS/BLUE SHIELD | Source: Ambulatory Visit | Attending: Family Medicine | Admitting: Family Medicine

## 2016-09-25 ENCOUNTER — Ambulatory Visit (INDEPENDENT_AMBULATORY_CARE_PROVIDER_SITE_OTHER): Payer: BLUE CROSS/BLUE SHIELD | Admitting: Family Medicine

## 2016-09-25 ENCOUNTER — Encounter: Payer: Self-pay | Admitting: Family Medicine

## 2016-09-25 VITALS — BP 130/80 | HR 84

## 2016-09-25 DIAGNOSIS — M542 Cervicalgia: Secondary | ICD-10-CM | POA: Insufficient documentation

## 2016-09-25 DIAGNOSIS — M999 Biomechanical lesion, unspecified: Secondary | ICD-10-CM | POA: Insufficient documentation

## 2016-09-25 DIAGNOSIS — D2122 Benign neoplasm of connective and other soft tissue of left lower limb, including hip: Secondary | ICD-10-CM

## 2016-09-25 DIAGNOSIS — M5010 Cervical disc disorder with radiculopathy, unspecified cervical region: Secondary | ICD-10-CM

## 2016-09-25 MED ORDER — PREDNISONE 50 MG PO TABS: 50.0000 mg | ORAL_TABLET | Freq: Every day | ORAL | 0 refills | Status: DC

## 2016-09-25 MED ORDER — GABAPENTIN 100 MG PO CAPS: 200.0000 mg | ORAL_CAPSULE | Freq: Every day | ORAL | 3 refills | Status: DC

## 2016-09-25 NOTE — Assessment & Plan Note (Signed)
Patient is having neck pain. Likely multifactorial be mostly muscle imbalances and poor posture. We discussed ergonomics at work. Responded well to osteopathic manipulation today. X-rays are pending. No signs of positive Spurling's today but patient does describe radicular symptoms though. Patient shoulder though seems to be unremarkable. Thoracic outlet syndrome was within the differential. We'll see how patient response to conservative therapy including home exercises and follow-up again in 3-4 weeks.

## 2016-09-25 NOTE — Assessment & Plan Note (Signed)
Decision today to treat with OMT was based on Physical Exam  After verbal consent patient was treated with HVLA, ME, FPR techniques in cervical, thoracic and rib areas  Patient tolerated the procedure well with improvement in symptoms  Patient given exercises, stretches and lifestyle modifications  See medications in patient instructions if given  Patient will follow up in 3-4 weeks      

## 2016-09-25 NOTE — Patient Instructions (Addendum)
Great to see you  Ice can still help  I think the custom orthotics will continue to help.  Gabapentin 200mg  at night Exercises 3 times a week.  If worsening then do prednisone daily for 5 days.  You responded well to manipulation  See me again in 3-4 weeks  Happy New Year!

## 2016-09-25 NOTE — Assessment & Plan Note (Signed)
Significant improvement. Doing very well overall. We discussed icing regimen and home exercises. Patient will continue with the custom orthotics. Follow-up as needed for this problem.

## 2016-10-16 ENCOUNTER — Ambulatory Visit: Payer: BLUE CROSS/BLUE SHIELD | Admitting: Family Medicine

## 2016-10-16 ENCOUNTER — Other Ambulatory Visit: Payer: Self-pay | Admitting: Family Medicine

## 2016-10-17 NOTE — Telephone Encounter (Signed)
Refill done.  

## 2016-10-28 ENCOUNTER — Encounter: Payer: Self-pay | Admitting: Family Medicine

## 2016-10-28 ENCOUNTER — Ambulatory Visit (INDEPENDENT_AMBULATORY_CARE_PROVIDER_SITE_OTHER): Payer: BLUE CROSS/BLUE SHIELD | Admitting: Family Medicine

## 2016-10-28 VITALS — BP 136/80 | HR 88 | Ht 64.0 in | Wt 191.0 lb

## 2016-10-28 DIAGNOSIS — M999 Biomechanical lesion, unspecified: Secondary | ICD-10-CM

## 2016-10-28 DIAGNOSIS — M5030 Other cervical disc degeneration, unspecified cervical region: Secondary | ICD-10-CM | POA: Diagnosis not present

## 2016-10-28 MED ORDER — GABAPENTIN 100 MG PO CAPS: 200.0000 mg | ORAL_CAPSULE | Freq: Every day | ORAL | 3 refills | Status: DC

## 2016-10-28 NOTE — Progress Notes (Signed)
[name] [place], [place] [address] Phone: [phone] Subjective:      CC:   neck pain f/u Left foot pain follow-up [id number]  Lisa Humphrey is a 51 y.o. female coming in with complaint of neck pain patient was found to have degenerative disc disease. Patient has not been doing exercises regularly. Has soreness. An states that the radicular symptoms have decreased since patient has been on the gabapentin.  [facility] was found to have a fibroma had been doing very well in custom orthotics and was given an injection 4 months ago. Patient states doing well still.   Past Medical History:  Diagnosis Date  . GERD (gastroesophageal reflux disease)   . Migraine   . Sleep apnea    CPAP  . Urticarial rash 06/02/2014   Past Surgical History:  Procedure Laterality Date  . WISDOM TOOTH EXTRACTION     Social History  Substance Use Topics  . Smoking status: Never Smoker  . Smokeless tobacco: Never Used  . Alcohol use Yes     Comment: occasionally   Allergies  Allergen Reactions  . Codeine Nausea And Vomiting  . Eggs Or Egg-Derived Products Other (See Comments)    "Allergy test was positive years ago, I eat eggs" per pt.  . Hydrocodone Nausea And Vomiting  . Penicillins Rash   Family History  Problem Relation Age of Onset  . Hypertension Mother   . Heart disease Mother     BYPASS AND  A ABLATION  . Heart attack Mother   . Stroke Mother   . Colon polyps Mother   . Diverticulosis Mother   . Heart attack Father   . Heart disease Father     OPEN HEART SURGERY/HEART FAILURE/ PACEMAKER AND DEFIB   . Stroke Father   . Colon polyps Father   . Diverticulosis Father   . Hiatal hernia Father   . Heart disease Sister     HEART ATTACK AGE 55/ HE HAS A STENTX1/ CABG X4 VESSELS AT AGE 27 / AT AGE 69 BRAIN STEM STROKE  . Colon cancer Neg Hx   . Esophageal cancer Neg Hx   . Rectal cancer Neg Hx   . Stomach cancer Neg Hx   . Colon  polyps Brother     Review of Systems: No headache, visual changes, nausea, vomiting, diarrhea, constipation, dizziness, abdominal pain, skin rash, fevers, chills, night sweats, weight loss, swollen lymph nodes, body aches, joint swelling, muscle aches, chest pain, shortness of breath, mood changes.     Blood pressure 136/80, pulse 88, height 5\' 4"  (1.626 m), weight 191 lb (86.6 kg), SpO2 99 %.  Systems examined below as of 10/28/16 General: NAD A&O x3 mood, affect normal  HEENT: Pupils equal, extraocular movements intact no nystagmus Respiratory: not short of breath at rest or with speaking Cardiovascular: No lower extremity edema, non tender Skin: Warm dry intact with no signs of infection or rash on extremities or on axial skeleton. Abdomen: Soft nontender, no masses Neuro: Cranial nerves  intact, neurovascularly intact in all extremities with 2+ DTRs and 2+ pulses. Lymph: No lymphadenopathy appreciated today  Gait normal with good balance and coordination.  MSK: Non tender with full range of motion and good stability and symmetric strength and tone of shoulders, elbows, wrist,  knee hips and ankles bilaterally.    Foot exam shows patient is some mild overpronation of the hindfoot. Mild breakdown of the transverse and longitudinal arch. No  tenderness on exam today.  Neck: Inspection unremarkable. No palpable stepoffs. Negative Spurling's maneuver. Limited range of motion lacking the last 5 of rotation bilaterally Grip strength and sensation normal in bilateral hands Strength good C4 to T1 distribution No sensory change to C4 to T1 Negative Hoffman sign bilaterally Reflexes normal  Osteopathic findings Cervical C2 flexed rotated and side bent right C4 flexed rotated and side bent left C7 flexed rotated and side bent left T3 extended rotated and side bent right inhaled third rib T7 extended rotated and side bent left L3 flexed rotated and side bent right Sacrum right on  right      Impression and Recommendations:     This case required medical decision making of moderate complexity.      Note: This dictation was prepared with Dragon dictation along with smaller phrase technology. Any transcriptional errors that result from this process are unintentional.

## 2016-10-28 NOTE — Assessment & Plan Note (Signed)
Decision today to treat with OMT was based on Physical Exam  After verbal consent patient was treated with HVLA, ME, FPR techniques in cervical, thoracic, lumbar and sacral areas  Patient tolerated the procedure well with improvement in symptoms  Patient given exercises, stretches and lifestyle modifications  See medications in patient instructions if given  Patient will follow up in 4-5 weeks 

## 2016-10-28 NOTE — Assessment & Plan Note (Signed)
Patient doesn't more of a degenerative disc disease. Patient doing well with conservative therapy. Discussed ergonomics. Work with Product/process development scientist. We'll use gabapentin on a more regular basis. Refilled today. We discussed icing regimen. Follow-up again in 4-5 weeks.

## 2016-10-28 NOTE — Patient Instructions (Signed)
You are doing well! Ice is your friend.  I a impressed  Refilled gabapentin but take as needed.  Keep working on the posture See me again in 4-5 weeks.

## 2016-11-27 NOTE — Progress Notes (Signed)
[facility] [place], [place] [address] Phone: [phone] Subjective:    CC:   neck pain f/u Left foot pain follow-up [id number]  Lisa Humphrey is a 52 y.o. female coming in with complaint of neck pain patient was found to have degenerative disc disease. Patient has not been doing exercises regularly.Patient at last exam was making some improvement. Patient states Having increasing tightness as well. Patient states that as a dull, throbbing aching pain. No radiation down the leg though. Patient states though that it does affect her sleep and sometimes her work. Patient states that her back and neck seems to be the same. Mild worsening in tightness still.  Patient  at foot was found to have a fibroma had been doing very well in custom orthotics and was given an injection 5 months ago. Overall patient states still no significant improvement. Continues to have pain almost daily. States that pressure on the area seems to be difficult. Wondering what else can be done.   Past Medical History:  Diagnosis Date  . GERD (gastroesophageal reflux disease)   . Migraine   . Sleep apnea    CPAP  . Urticarial rash 06/02/2014   Past Surgical History:  Procedure Laterality Date  . WISDOM TOOTH EXTRACTION     Social History  Substance Use Topics  . Smoking status: Never Smoker  . Smokeless tobacco: Never Used  . Alcohol use Yes     Comment: occasionally   Allergies  Allergen Reactions  . Codeine Nausea And Vomiting  . Eggs Or Egg-Derived Products Other (See Comments)    "Allergy test was positive years ago, I eat eggs" per pt.  . Hydrocodone Nausea And Vomiting  . Penicillins Rash   Family History  Problem Relation Age of Onset  . Hypertension Mother   . Heart disease Mother     BYPASS AND  A ABLATION  . Heart attack Mother   . Stroke Mother   . Colon polyps Mother   . Diverticulosis Mother   . Heart attack Father   . Heart disease Father    OPEN HEART SURGERY/HEART FAILURE/ PACEMAKER AND DEFIB   . Stroke Father   . Colon polyps Father   . Diverticulosis Father   . Hiatal hernia Father   . Heart disease Sister     HEART ATTACK AGE 62/ HE HAS A STENTX1/ CABG X4 VESSELS AT AGE 62 / AT AGE 19 BRAIN STEM STROKE  . Colon cancer Neg Hx   . Esophageal cancer Neg Hx   . Rectal cancer Neg Hx   . Stomach cancer Neg Hx   . Colon polyps Brother     Review of Systems: No headache, visual changes, nausea, vomiting, diarrhea, constipation, dizziness, abdominal pain, skin rash, fevers, chills, night sweats, weight loss, swollen lymph nodes, body aches, joint swelling, muscle aches, chest pain, shortness of breath, mood changes.      Blood pressure 130/80, pulse 77, height 5\' 4"  (1.626 m), weight 195 lb (88.5 kg), SpO2 98 %.  Systems examined below as of 11/28/16 General: NAD A&O x3 mood, affect normal  HEENT: Pupils equal, extraocular movements intact no nystagmus Respiratory: not short of breath at rest or with speaking Cardiovascular: No lower extremity edema, non tender Skin: Warm dry intact with no signs of infection or rash on extremities or on axial skeleton. Abdomen: Soft nontender, no masses Neuro: Cranial nerves  intact, neurovascularly intact in all extremities with 2+ DTRs and 2+  pulses. Lymph: No lymphadenopathy appreciated today  Gait normal with good balance and coordination.  MSK: Non tender with full range of motion and good stability and symmetric strength and tone of shoulders, elbows, wrist,  knee hips and ankles bilaterally.   Foot exam shows the patient still has pain over the plantar aspect of the heel. Still having significant pain in that area. No masses palpated. Fibroma possibly noted just proximal to the attachment of the fascia.  Neck: Inspection unremarkable. No palpable stepoffs. Negative Spurling's maneuver. Continue mild limitation in range of motion of the neck 5 in all planes. Strength good C4 to  T1 distribution No sensory change to C4 to T1 Negative Hoffman sign bilaterally Reflexes normal  Osteopathic findings Cervical C2 flexed rotated and side bent right C5 flexed rotated and side bent left T3 extended rotated and side bent right inhaled third rib T8 extended rotated and side bent left L3 flexed rotated and side bent right Sacrum left on left    Impression and Recommendations:     This case required medical decision making of moderate complexity.      Note: This dictation was prepared with Dragon dictation along with smaller phrase technology. Any transcriptional errors that result from this process are unintentional.

## 2016-11-28 ENCOUNTER — Encounter: Payer: Self-pay | Admitting: Family Medicine

## 2016-11-28 ENCOUNTER — Ambulatory Visit (INDEPENDENT_AMBULATORY_CARE_PROVIDER_SITE_OTHER): Payer: BLUE CROSS/BLUE SHIELD | Admitting: Family Medicine

## 2016-11-28 VITALS — BP 130/80 | HR 77 | Ht 64.0 in | Wt 195.0 lb

## 2016-11-28 DIAGNOSIS — M542 Cervicalgia: Secondary | ICD-10-CM | POA: Diagnosis not present

## 2016-11-28 DIAGNOSIS — D2122 Benign neoplasm of connective and other soft tissue of left lower limb, including hip: Secondary | ICD-10-CM

## 2016-11-28 DIAGNOSIS — M5030 Other cervical disc degeneration, unspecified cervical region: Secondary | ICD-10-CM

## 2016-11-28 DIAGNOSIS — M999 Biomechanical lesion, unspecified: Secondary | ICD-10-CM

## 2016-11-28 NOTE — Assessment & Plan Note (Signed)
Still aggravating patient. Patient is proximally 4 months since previous injection. Discussed with patient again about the potential of repeating the injection which patient does not want to regularly. Patient is going to see another provider to see if there is any other treatment options. Patient will come back if she feels that any of the other injections could be beneficial.

## 2016-11-28 NOTE — Patient Instructions (Addendum)
Good to see you as always.  Gabapentin 200mg  at night for 10 nights Ice is still a good idea. Continue the vitamin D Ask PCP about autoimmune labs.  Avoid being barefoot still  Stop the turmeric  See me again in 1-2 months I will keep you updated on the tennex machine and consider PRP injections.

## 2016-11-28 NOTE — Assessment & Plan Note (Signed)
Mild worsening today. Mild decrease in range of motion. Discussed with patient at great length. 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 Patient does respond well to osteopathic manipulation. We discussed which activities doing which was to avoid. Follow-up again in 4-6 weeks.

## 2016-11-28 NOTE — Assessment & Plan Note (Signed)
Decision today to treat with OMT was based on Physical Exam  After verbal consent patient was treated with HVLA, ME, FPR techniques in cervical, thoracic, lumbar and sacral areas  Patient tolerated the procedure well with improvement in symptoms  Patient given exercises, stretches and lifestyle modifications  See medications in patient instructions if given  Patient will follow up in 4-6 weeks 

## 2016-12-02 ENCOUNTER — Ambulatory Visit: Payer: Self-pay | Admitting: Podiatry

## 2016-12-17 ENCOUNTER — Other Ambulatory Visit: Payer: Self-pay | Admitting: Family Medicine

## 2016-12-29 NOTE — Progress Notes (Signed)
[name] [place], [place] [address] Phone: [phone] Subjective:    CC:   neck pain f/u Left foot pain follow-up [id number]  Lisa Humphrey is a 51 y.o. female coming in with complaint of neck pain patient was found to have degenerative disc disease. Patient has not been doing exercises regularly.Patient at last exam was making some improvement. Respond fairly well to osteopathic manipulation previously. Patient was to continue to work on Engineer, building services. Patient states overall seems to be doing somewhat better. She denies any numbness or tingling. States that it still some aching pain from time to time but nothing severe. Concern though more of the weight gain that she has had what has longer she has been on the gabapentin.  Patient  at foot was found to have a fibroma had been doing very well in custom orthotics and was given an injection 6 months ago. Patient was contemplating the PRP injection versus the potential Tenex.   Past Medical History:  Diagnosis Date  . GERD (gastroesophageal reflux disease)   . Migraine   . Sleep apnea    CPAP  . Urticarial rash 06/02/2014   Past Surgical History:  Procedure Laterality Date  . WISDOM TOOTH EXTRACTION     Social History  Substance Use Topics  . Smoking status: Never Smoker  . Smokeless tobacco: Never Used  . Alcohol use Yes     Comment: occasionally   Allergies  Allergen Reactions  . Codeine Nausea And Vomiting  . Eggs Or Egg-Derived Products Other (See Comments)    "Allergy test was positive years ago, I eat eggs" per pt.  . Hydrocodone Nausea And Vomiting  . Penicillins Rash   Family History  Problem Relation Age of Onset  . Hypertension Mother   . Heart disease Mother     BYPASS AND  A ABLATION  . Heart attack Mother   . Stroke Mother   . Colon polyps Mother   . Diverticulosis Mother   . Heart attack Father   . Heart disease Father     OPEN HEART SURGERY/HEART  FAILURE/ PACEMAKER AND DEFIB   . Stroke Father   . Colon polyps Father   . Diverticulosis Father   . Hiatal hernia Father   . Colon polyps Brother   . Heart disease Sister     HEART ATTACK AGE 9/ HE HAS A STENTX1/ CABG X4 VESSELS AT AGE 59 / AT AGE 43 BRAIN STEM STROKE  . Colon cancer Neg Hx   . Esophageal cancer Neg Hx   . Rectal cancer Neg Hx   . Stomach cancer Neg Hx     Review of Systems: No headache, visual changes, nausea, vomiting, diarrhea, constipation, dizziness, abdominal pain, skin rash, fevers, chills, night sweats, weight loss, swollen lymph nodes, chest pain, shortness of breath, mood changes. Positive muscle aches, joint swelling, body aches   There were no vitals taken for this visit.  Systems examined below as of 12/30/16 General: NAD A&O x3 mood, affect normal  HEENT: Pupils equal, extraocular movements intact no nystagmus Respiratory: not short of breath at rest or with speaking Cardiovascular: No lower extremity edema, non tender Skin: Warm dry intact with no signs of infection or rash on extremities or on axial skeleton. Abdomen: Soft nontender, no masses Neuro: Cranial nerves  intact, neurovascularly intact in all extremities with 2+ DTRs and 2+ pulses. Lymph: No lymphadenopathy appreciated today  Gait normal with good balance and coordination.  MSK: Non tender with full range of motion and good stability and symmetric strength and tone of shoulders, elbows, wrist,  knee hips and ankles bilaterally.   Foot exam shows the patient still has pain over the plantar aspect of the heel. Swimming pain on the foot.  Neck: Inspection unremarkable. No palpable stepoffs. Negative Spurling's maneuver. Mild improvement in range of motion but still lacking the last 5 of extension Strength good C4 to T1 distribution No sensory change to C4 to T1  Negative Hoffman sign bilaterally Reflexes normal  Osteopathic findings Cervical C2 flexed rotated and side bent  right C4 flexed rotated and side bent left C6 flexed rotated and side bent left T3 extended rotated and side bent right inhaled third rib T9 extended rotated and side bent left L2 flexed rotated and side bent right Sacrum right on right     Impression and Recommendations:     This case required medical decision making of moderate complexity.      Note: This dictation was prepared with Dragon dictation along with smaller phrase technology. Any transcriptional errors that result from this process are unintentional.

## 2016-12-30 ENCOUNTER — Encounter: Payer: Self-pay | Admitting: Family Medicine

## 2016-12-30 ENCOUNTER — Ambulatory Visit (INDEPENDENT_AMBULATORY_CARE_PROVIDER_SITE_OTHER): Payer: BLUE CROSS/BLUE SHIELD | Admitting: Family Medicine

## 2016-12-30 VITALS — BP 132/70 | HR 82 | Resp 16 | Wt 193.8 lb

## 2016-12-30 DIAGNOSIS — D2122 Benign neoplasm of connective and other soft tissue of left lower limb, including hip: Secondary | ICD-10-CM

## 2016-12-30 DIAGNOSIS — M999 Biomechanical lesion, unspecified: Secondary | ICD-10-CM | POA: Diagnosis not present

## 2016-12-30 DIAGNOSIS — M5030 Other cervical disc degeneration, unspecified cervical region: Secondary | ICD-10-CM | POA: Diagnosis not present

## 2016-12-30 MED ORDER — VENLAFAXINE HCL ER 37.5 MG PO CP24: 37.5000 mg | ORAL_CAPSULE | Freq: Every day | ORAL | 1 refills | Status: DC

## 2016-12-30 NOTE — Progress Notes (Signed)
Pre-visit discussion using our clinic review tool. No additional management support is needed unless otherwise documented below in the visit note.  

## 2016-12-30 NOTE — Assessment & Plan Note (Signed)
Stable but potentially worsening. Patient will be scheduled for PRP. We'll bring her [place] Walker. Patient will do a return to activity slowly over the course of time and patient will be ready for her trip in June

## 2016-12-30 NOTE — Assessment & Plan Note (Signed)
Decision today to treat with OMT was based on Physical Exam  After verbal consent patient was treated with HVLA, ME, FPR techniques in cervical, thoracic, lumbar and sacral areas  Patient tolerated the procedure well with improvement in symptoms  Patient given exercises, stretches and lifestyle modifications  See medications in patient instructions if given  Patient will follow up in 4-6 weeks 

## 2016-12-30 NOTE — Patient Instructions (Addendum)
Great to see you as always.  We are going to try PRP Effexor 37.5mg  daily  Stop the gabapentin but go to 3 times a week for 1 week then discontinue.  For the back you are doing good. Keep focusing on posture See you soon!

## 2016-12-30 NOTE — Assessment & Plan Note (Signed)
Patient does have some degenerative changes of the cervical spine. Discussed with patient at great length. We discussed discontinuing the gabapentin starting her on Effexor.

## 2017-01-14 ENCOUNTER — Ambulatory Visit (INDEPENDENT_AMBULATORY_CARE_PROVIDER_SITE_OTHER): Payer: BLUE CROSS/BLUE SHIELD | Admitting: Family Medicine

## 2017-01-14 ENCOUNTER — Encounter: Payer: Self-pay | Admitting: Family Medicine

## 2017-01-14 DIAGNOSIS — D2122 Benign neoplasm of connective and other soft tissue of left lower limb, including hip: Secondary | ICD-10-CM

## 2017-01-14 MED ORDER — ALENDRONATE SODIUM 70 MG PO TABS: 70.0000 mg | ORAL_TABLET | ORAL | 0 refills | Status: DC

## 2017-01-14 MED ORDER — VITAMIN D (ERGOCALCIFEROL) 1.25 MG (50000 UNIT) PO CAPS: 50000.0000 [IU] | ORAL_CAPSULE | ORAL | 0 refills | Status: DC

## 2017-01-14 NOTE — Assessment & Plan Note (Signed)
Pain given injection today. Tolerated the procedure well. We discussed icing regimen and home exercises. Started once weekly vitamin D as well as Fosamax patient may be having a potential stress reaction. We discussed which activities doing which ones to avoid. Patient will follow-up again in 3 weeks.

## 2017-01-14 NOTE — Progress Notes (Signed)
[name] [place] [place], [place] [address] Phone: [phone] Subjective:      CC: Left heel pain  F/u  [id number]  Lisa Humphrey is a 51 y.o. female coming in with complaint of left heel pain .  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  Given an injection in the fibroma itself. Patient also has plantar fasciitis. Had responded somewhat to the injections but was started having worsening pain again. Patient has done all other conservative therapy. Patient is here for PRP injection.   Past Medical History:  Diagnosis Date  . GERD (gastroesophageal reflux disease)   . Migraine   . Sleep apnea    CPAP  . Urticarial rash 06/02/2014   Past Surgical History:  Procedure Laterality Date  . WISDOM TOOTH EXTRACTION     Social History  Substance Use Topics  . Smoking status: Never Smoker  . Smokeless tobacco: Never Used  . Alcohol use Yes     Comment: occasionally   Allergies  Allergen Reactions  . Codeine Nausea And Vomiting  . Eggs Or Egg-Derived Products Other (See Comments)    "Allergy test was positive years ago, I eat eggs" per pt.  . Hydrocodone Nausea And Vomiting  . Penicillins Rash   Family History  Problem Relation Age of Onset  . Hypertension Mother   . Heart disease Mother     BYPASS AND  A ABLATION  . Heart attack Mother   . Stroke Mother   . Colon polyps Mother   . Diverticulosis Mother   . Heart attack Father   . Heart disease Father     OPEN HEART SURGERY/HEART FAILURE/ PACEMAKER AND DEFIB   . Stroke Father   . Colon polyps Father   . Diverticulosis Father   . Hiatal hernia Father   . Colon polyps Brother   . Heart disease Sister     HEART ATTACK AGE 10/ HE HAS A STENTX1/ CABG X4 VESSELS AT AGE 67 / AT AGE 49 BRAIN STEM STROKE  . Colon cancer Neg Hx   . Esophageal cancer Neg Hx   . Rectal cancer Neg Hx   . Stomach cancer Neg Hx    There were no vitals taken for this  visit.   Systems examined below as of 01/14/17 General: NAD A&O x3 mood, affect normal  HEENT: Pupils equal, extraocular movements intact no nystagmus Respiratory: not short of breath at rest or with speaking Cardiovascular: No lower extremity edema, non tender Skin: Warm dry intact with no signs of infection or rash on extremities or on axial skeleton. Abdomen: Soft nontender, no masses Neuro: Cranial nerves  intact, neurovascularly intact in all extremities with 2+ DTRs and 2+ pulses. Lymph: No lymphadenopathy appreciated today  Gait normal with good balance and coordination.  MSK: Non tender with full range of motion and good stability and symmetric strength and tone of shoulders, elbows, wrist,  knee hips and ankles bilaterally.  Foot exam shows patient is some mild overpronation of the hindfoot. Mild breakdown of the transverse and longitudinal arch. Mild discomfort at the medial calcaneal region as well as the lateral calcaneal region.   Procedure: Real-time Ultrasound Guided Injection of left fibroma Device: GE Logiq Q7 Ultrasound guided injection is preferred based studies that show increased duration, increased effect, greater accuracy, decreased procedural pain, increased response rate, and decreased cost with ultrasound guided versus blind injection.  Verbal informed consent obtained.  Time-out  conducted.  Noted no overlying erythema, induration, or other signs of local infection.  Skin prepped in a sterile fashion.  Local anesthesia: Topical Ethyl chloride.  With sterile technique and under real time ultrasound guidance:  Patient was given an injection with a 21-gauge 2 inch needle into the medial side of the calcaneal region. Patient had 1 mL of 0.5% Marcaine and then injected and 4 mL of PRP pre-centrifuge material. Completed without difficulty  Pain immediately resolved suggesting accurate placement of the medication.  Advised to call if fevers/chills, erythema, induration,  drainage, or persistent bleeding.  Images permanently stored and available for review in the ultrasound unit.  Impression: Technically successful ultrasound guided injection.     Impression and Recommendations:     This case required medical decision making of moderate complexity.      Note: This dictation was prepared with Dragon dictation along with smaller phrase technology. Any transcriptional errors that result from this process are unintentional.

## 2017-01-14 NOTE — Progress Notes (Signed)
Pre-visit discussion using our clinic review tool. No additional management support is needed unless otherwise documented below in the visit note.  

## 2017-01-14 NOTE — Patient Instructions (Signed)
Good to see you.  Try not to ice or anti-inflammatories for next 72 hours.  Stay active though but nothing more then rregular activities during that time.  Look at the handout otherwise and advance slowly  See me again in 3 weeks.

## 2017-01-15 ENCOUNTER — Encounter: Payer: Self-pay | Admitting: Family Medicine

## 2017-02-09 NOTE — Progress Notes (Signed)
[name] [place] [place], [place] [address] Phone: [phone] Subjective:      CC: Left heel pain  F/u  [id number]  Lisa Humphrey is a 51 y.o. female coming in with complaint of left heel pain .  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  Patient and failed all conservative therapy and started on PRP injection. Patient had this done 3 weeks ago. States that she is making significant improvement. Patient is able to increase her activity recently. Patient states that it is feeling much better. Some mild pain over the first toe now but no pain at the heel at all. Continuing the same medications.  Still having some neck pain. Has responded well to osteopathic manipulation. Some of it is associated to work environment as well as stress. Denies any radiation down the arm or any numbness or tingling.   Past Medical History:  Diagnosis Date  . GERD (gastroesophageal reflux disease)   . Migraine   . Sleep apnea    CPAP  . Urticarial rash 06/02/2014   Past Surgical History:  Procedure Laterality Date  . WISDOM TOOTH EXTRACTION     Social History  Substance Use Topics  . Smoking status: Never Smoker  . Smokeless tobacco: Never Used  . Alcohol use Yes     Comment: occasionally   Allergies  Allergen Reactions  . Codeine Nausea And Vomiting  . Eggs Or Egg-Derived Products Other (See Comments)    "Allergy test was positive years ago, I eat eggs" per pt.  . Hydrocodone Nausea And Vomiting  . Penicillins Rash   Family History  Problem Relation Age of Onset  . Hypertension Mother   . Heart disease Mother        BYPASS AND  A ABLATION  . Heart attack Mother   . Stroke Mother   . Colon polyps Mother   . Diverticulosis Mother   . Heart attack Father   . Heart disease Father        OPEN HEART SURGERY/HEART FAILURE/ PACEMAKER AND DEFIB   . Stroke Father   . Colon polyps Father   . Diverticulosis Father   . Hiatal  hernia Father   . Colon polyps Brother   . Heart disease Sister        HEART ATTACK AGE 44/ HE HAS A STENTX1/ CABG X4 VESSELS AT AGE 8 / AT AGE 48 BRAIN STEM STROKE  . Colon cancer Neg Hx   . Esophageal cancer Neg Hx   . Rectal cancer Neg Hx   . Stomach cancer Neg Hx    Review of Systems: No headache, visual changes, nausea, vomiting, diarrhea, constipation, dizziness, abdominal pain, skin rash, fevers, chills, night sweats, weight loss, swollen lymph nodes, body aches, joint swelling, muscle aches, chest pain, shortness of breath, mood changes.     Blood pressure 122/78, pulse 93, height 5\' 4"  (1.626 m), weight 195 lb (88.5 kg), SpO2 97 %.   Systems examined below as of 02/10/17 General: NAD A&O x3 mood, affect normal  HEENT: Pupils equal, extraocular movements intact no nystagmus Respiratory: not short of breath at rest or with speaking Cardiovascular: No lower extremity edema, non tender Skin: Warm dry intact with no signs of infection or rash on extremities or on axial skeleton. Abdomen: Soft nontender, no masses Neuro: Cranial nerves  intact, neurovascularly intact in all extremities with 2+ DTRs and 2+ pulses. Lymph: No lymphadenopathy appreciated today  Gait normal with good balance and coordination.  MSK: Non tender with full range of motion and good stability and symmetric strength and tone of shoulders, elbows, wrist,  knee hips and ankles bilaterally.  Foot exam shows patient is some mild overpronation of the hindfoot. Nontender on exam today. Patient still has the pronation as stated.   Neck: Inspection unremarkable. No palpable stepoffs. Negative Spurling's maneuver. Lacks last 5 of rotation and side bending bilaterally Grip strength and sensation normal in bilateral hands Strength good C4 to T1 distribution No sensory change to C4 to T1 Negative Hoffman sign bilaterally Reflexes normal  Osteopathic findings Cervical C2 flexed rotated and side bent right C4  flexed rotated and side bent left C6 flexed rotated and side bent left T3 extended rotated and side bent right inhaled third rib T9 extended rotated and side bent left L2 flexed rotated and side bent right Sacrum right on right    Impression and Recommendations:     This case required medical decision making of moderate complexity.      Note: This dictation was prepared with Dragon dictation along with smaller phrase technology. Any transcriptional errors that result from this process are unintentional.

## 2017-02-10 ENCOUNTER — Ambulatory Visit (INDEPENDENT_AMBULATORY_CARE_PROVIDER_SITE_OTHER): Payer: BLUE CROSS/BLUE SHIELD | Admitting: Family Medicine

## 2017-02-10 ENCOUNTER — Encounter: Payer: Self-pay | Admitting: Family Medicine

## 2017-02-10 VITALS — BP 122/78 | HR 93 | Ht 64.0 in | Wt 195.0 lb

## 2017-02-10 DIAGNOSIS — D2122 Benign neoplasm of connective and other soft tissue of left lower limb, including hip: Secondary | ICD-10-CM | POA: Diagnosis not present

## 2017-02-10 DIAGNOSIS — M5030 Other cervical disc degeneration, unspecified cervical region: Secondary | ICD-10-CM | POA: Diagnosis not present

## 2017-02-10 DIAGNOSIS — M999 Biomechanical lesion, unspecified: Secondary | ICD-10-CM

## 2017-02-10 NOTE — Assessment & Plan Note (Signed)
Significant improvement.  Overall. Doing well. Patient did have changes to her orthotic. I do think that this will do relatively well. We'll increase activity. We discussed over-the-counter medications we discussed proper shoes.  as long as patient does well follow-up as needed.

## 2017-02-10 NOTE — Patient Instructions (Signed)
You are doing great  [place] 5 or clifton.  Should do well  See me again in 4-6 weeks when you are done with the meds and we will check your back again

## 2017-02-10 NOTE — Assessment & Plan Note (Signed)
Decision today to treat with OMT was based on Physical Exam  After verbal consent patient was treated with HVLA, ME, FPR techniques in cervical, thoracic, lumbar and sacral areas  Patient tolerated the procedure well with improvement in symptoms  Patient given exercises, stretches and lifestyle modifications  See medications in patient instructions if given  Patient will follow up in 4-6 weeks 

## 2017-02-10 NOTE — Assessment & Plan Note (Signed)
Stable overall. Continues to respond fairly well to osteopathic manipulation. Encourage her to strengthen the upper back. We discussed objective recently do as well as avoiding certain motions. Patient's encouraged to properly lifting mechanics. Did respond well to manipulation. Follow-up again in 4-6 weeks

## 2017-02-28 ENCOUNTER — Other Ambulatory Visit: Payer: Self-pay | Admitting: Family Medicine

## 2017-03-02 NOTE — Telephone Encounter (Signed)
Refill done.  

## 2017-03-07 ENCOUNTER — Other Ambulatory Visit: Payer: Self-pay | Admitting: Family Medicine

## 2017-03-11 ENCOUNTER — Ambulatory Visit: Payer: BLUE CROSS/BLUE SHIELD | Admitting: Family Medicine

## 2017-03-12 ENCOUNTER — Ambulatory Visit (INDEPENDENT_AMBULATORY_CARE_PROVIDER_SITE_OTHER): Payer: BLUE CROSS/BLUE SHIELD | Admitting: Family Medicine

## 2017-03-12 ENCOUNTER — Ambulatory Visit: Payer: Self-pay

## 2017-03-12 ENCOUNTER — Encounter: Payer: Self-pay | Admitting: Family Medicine

## 2017-03-12 VITALS — BP 132/82 | HR 82 | Ht 64.0 in | Wt 198.0 lb

## 2017-03-12 DIAGNOSIS — M999 Biomechanical lesion, unspecified: Secondary | ICD-10-CM

## 2017-03-12 DIAGNOSIS — M542 Cervicalgia: Secondary | ICD-10-CM | POA: Diagnosis not present

## 2017-03-12 DIAGNOSIS — S90122A Contusion of left lesser toe(s) without damage to nail, initial encounter: Secondary | ICD-10-CM | POA: Insufficient documentation

## 2017-03-12 DIAGNOSIS — M79672 Pain in left foot: Secondary | ICD-10-CM

## 2017-03-12 NOTE — Assessment & Plan Note (Signed)
Responded very well to osteopathic manipulation. We discussed the possibility of trigger point injections at follow-up. We discussed icing regimen. Discussed core stability. Posterior ergonomics. Follow-up again in 6 weeks

## 2017-03-12 NOTE — Patient Instructions (Signed)
Good to see you  I hope the change in the orthotics Ice is your friend.  Keep working on the posture See me again in 6-8 weeks

## 2017-03-12 NOTE — Assessment & Plan Note (Signed)
I believe that the patient does have more of a foot contusion. We discussed icing regimen and home exercise. Discussed which activities doing which ones to avoid. Patient will start increasing activity. May changes to custom orthotics. Follow-up again in 4-6 weeks

## 2017-03-12 NOTE — Progress Notes (Signed)
[name] [place] [place], [place] [address] Phone: [phone] Subjective:      CC: Left heel pain  F/u  [id number]  Lisa Humphrey is a 51 y.o. female coming in with complaint of left heel pain .  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  Patient had PRP injection is doing 95% better. Very minimal pain. More pain on the lateral aspect of the foot now. The different. No swelling. Did go hiking in that seem to exacerbate it.  Still having some neck pain. Describes the pain as a dull aching sensation. States that this still tightness of the trapezius. Denies any radiation into the activities. Has responded fairly well to manipulation previously.   Past Medical History:  Diagnosis Date  . GERD (gastroesophageal reflux disease)   . Migraine   . Sleep apnea    CPAP  . Urticarial rash 06/02/2014   Past Surgical History:  Procedure Laterality Date  . WISDOM TOOTH EXTRACTION     Social History  Substance Use Topics  . Smoking status: Never Smoker  . Smokeless tobacco: Never Used  . Alcohol use Yes     Comment: occasionally   Allergies  Allergen Reactions  . Codeine Nausea And Vomiting  . Eggs Or Egg-Derived Products Other (See Comments)    "Allergy test was positive years ago, I eat eggs" per pt.  . Hydrocodone Nausea And Vomiting  . Penicillins Rash   Family History  Problem Relation Age of Onset  . Hypertension Mother   . Heart disease Mother        BYPASS AND  A ABLATION  . Heart attack Mother   . Stroke Mother   . Colon polyps Mother   . Diverticulosis Mother   . Heart attack Father   . Heart disease Father        OPEN HEART SURGERY/HEART FAILURE/ PACEMAKER AND DEFIB   . Stroke Father   . Colon polyps Father   . Diverticulosis Father   . Hiatal hernia Father   . Colon polyps Brother   . Heart disease Sister        HEART ATTACK AGE 54/ HE HAS A STENTX1/ CABG X4 VESSELS AT AGE 74 / AT AGE 1 BRAIN  STEM STROKE  . Colon cancer Neg Hx   . Esophageal cancer Neg Hx   . Rectal cancer Neg Hx   . Stomach cancer Neg Hx    Review of Systems: No headache, visual changes, nausea, vomiting, diarrhea, constipation, dizziness, abdominal pain, skin rash, fevers, chills, night sweats, weight loss, swollen lymph nodes, body aches, joint swelling, muscle aches, chest pain, shortness of breath, mood changes.    Blood pressure 132/82, pulse 82, height 5\' 4"  (1.626 m), weight 198 lb (89.8 kg).   Systems examined below as of 03/12/17 General: NAD A&O x3 mood, affect normal  HEENT: Pupils equal, extraocular movements intact no nystagmus Respiratory: not short of breath at rest or with speaking Cardiovascular: No lower extremity edema, non tender Skin: Warm dry intact with no signs of infection or rash on extremities or on axial skeleton. Abdomen: Soft nontender, no masses Neuro: Cranial nerves  intact, neurovascularly intact in all extremities with 2+ DTRs and 2+ pulses. Lymph: No lymphadenopathy appreciated today  Gait normal with good balance and coordination.  MSK: Non tender with full range of motion and good stability and symmetric strength and tone of shoulders, elbows, wrist,  knee hips  and ankles bilaterally.  Foot exam shows patient is some mild overpronation of the hindfoot. Mild increasing tenderness to palpation over the lateral aspect of the fifth metatarsal. Seems to be more proximal. No bony normality noted. Neurovascular intact distally.   Limited muscular skeletal ultrasound was performed and interpreted by Lyndal Pulley  Limited ultrasound of the fifth metatarsal on the left side shows the patient does have what appears to be more of a bone contusion versus possible early stress fracture. No vertical defect noted. Hypoechoic changes and increasing Doppler flow Pressure: Lateral bone contusion.   Neck: Inspection unremarkable. No palpable stepoffs. Negative Spurling's  maneuver. Mild limitation in side bending bilaterally of 10 Grip strength and sensation normal in bilateral hands Strength good C4 to T1 distribution No sensory change to C4 to T1 Negative Hoffman sign bilaterally Reflexes normal  Osteopathic findings C2 flexed rotated and side bent right C4 flexed rotated and side bent left C6 flexed rotated and side bent left T3 extended rotated and side bent right inhaled third rib T9 extended rotated and side bent left L2 flexed rotated and side bent right Sacrum right on right    Impression and Recommendations:     This case required medical decision making of moderate complexity.      Note: This dictation was prepared with Dragon dictation along with smaller phrase technology. Any transcriptional errors that result from this process are unintentional.

## 2017-03-12 NOTE — Assessment & Plan Note (Signed)
Decision today to treat with OMT was based on Physical Exam  After verbal consent patient was treated with HVLA, ME, FPR techniques in cervical, thoracic, lumbar and sacral areas  Patient tolerated the procedure well with improvement in symptoms  Patient given exercises, stretches and lifestyle modifications  See medications in patient instructions if given  Patient will follow up in 6 weeks 

## 2017-04-23 ENCOUNTER — Ambulatory Visit (INDEPENDENT_AMBULATORY_CARE_PROVIDER_SITE_OTHER): Payer: BLUE CROSS/BLUE SHIELD | Admitting: Family Medicine

## 2017-04-23 ENCOUNTER — Encounter: Payer: Self-pay | Admitting: Family Medicine

## 2017-04-23 VITALS — BP 128/88 | HR 94 | Wt 200.0 lb

## 2017-04-23 DIAGNOSIS — M999 Biomechanical lesion, unspecified: Secondary | ICD-10-CM | POA: Diagnosis not present

## 2017-04-23 DIAGNOSIS — M542 Cervicalgia: Secondary | ICD-10-CM | POA: Diagnosis not present

## 2017-04-23 MED ORDER — VENLAFAXINE HCL ER 37.5 MG PO CP24: 37.5000 mg | ORAL_CAPSULE | Freq: Every day | ORAL | 2 refills | Status: DC

## 2017-04-23 NOTE — Patient Instructions (Signed)
I am so impressed keep it up! Refilled your medicine pick it up when you need it.  Keep doing what you are doing and work on the posture Cervical traction is fine but I like inversion better See em again in 8 weeks

## 2017-04-23 NOTE — Assessment & Plan Note (Signed)
Decision today to treat with OMT was based on Physical Exam  After verbal consent patient was treated with HVLA, ME, FPR techniques in cervical, thoracic, lumbar and sacral areas  Patient tolerated the procedure well with improvement in symptoms  Patient given exercises, stretches and lifestyle modifications  See medications in patient instructions if given  Patient will follow up in 8 weeks 

## 2017-04-23 NOTE — Progress Notes (Signed)
[facility] [address] [place], [place] [address] Phone: [phone] Subjective:      CC: Left heel pain  F/u  [id number]  Lisa Humphrey is a 51 y.o. female coming in with complaint of left heel pain .  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  Patient had PRP injection is doing 95% better.Patient was able to go on long walks and did not have any significant pain whatsoever. Very happy with the results. Custom orthotics have been helping as well.  Still having some neck pain. Describes the pain as a dull aching sensation. States that this still tightness of the trapezius. Continues to have some mild tightness. Does think some of it is secondary to her regular daily activities with her job. Patient is having some mild increase in low back pain as well. Can severe though.   Past Medical History:  Diagnosis Date  . GERD (gastroesophageal reflux disease)   . Migraine   . Sleep apnea    CPAP  . Urticarial rash 06/02/2014   Past Surgical History:  Procedure Laterality Date  . WISDOM TOOTH EXTRACTION     Social History  Substance Use Topics  . Smoking status: Never Smoker  . Smokeless tobacco: Never Used  . Alcohol use Yes     Comment: occasionally   Allergies  Allergen Reactions  . Codeine Nausea And Vomiting  . Eggs Or Egg-Derived Products Other (See Comments)    "Allergy test was positive years ago, I eat eggs" per pt.  . Hydrocodone Nausea And Vomiting  . Penicillins Rash   Family History  Problem Relation Age of Onset  . Hypertension Mother   . Heart disease Mother        BYPASS AND  A ABLATION  . Heart attack Mother   . Stroke Mother   . Colon polyps Mother   . Diverticulosis Mother   . Heart attack Father   . Heart disease Father        OPEN HEART SURGERY/HEART FAILURE/ PACEMAKER AND DEFIB   . Stroke Father   . Colon polyps Father   . Diverticulosis Father   . Hiatal hernia Father   . Colon polyps  Brother   . Heart disease Sister        HEART ATTACK AGE 38/ HE HAS A STENTX1/ CABG X4 VESSELS AT AGE 60 / AT AGE 81 BRAIN STEM STROKE  . Colon cancer Neg Hx   . Esophageal cancer Neg Hx   . Rectal cancer Neg Hx   . Stomach cancer Neg Hx    Review of Systems: No headache, visual changes, nausea, vomiting, diarrhea, constipation, dizziness, abdominal pain, skin rash, fevers, chills, night sweats, weight loss, swollen lymph nodes, body aches, joint swelling, muscle aches, chest pain, shortness of breath, mood changes.    Blood pressure 128/88, pulse 94, weight 200 lb (90.7 kg).   Systems examined below as of 04/23/17 General: NAD A&O x3 mood, affect normal  HEENT: Pupils equal, extraocular movements intact no nystagmus Respiratory: not short of breath at rest or with speaking Cardiovascular: No lower extremity edema, non tender Skin: Warm dry intact with no signs of infection or rash on extremities or on axial skeleton. Abdomen: Soft nontender, no masses Neuro: Cranial nerves  intact, neurovascularly intact in all extremities with 2+ DTRs and 2+ pulses. Lymph: No lymphadenopathy appreciated today  Gait normal with good balance and coordination.  MSK: Non tender with  full range of motion and good stability and symmetric strength and tone of shoulders, elbows, wrist,  knee hips and ankles bilaterally.  Foot exam shows the breakdown of the transverse arch and mild of the longitudinal arch. Nontender on exam today.    Neck: Inspection unremarkable. No palpable stepoffs. Negative Spurling's maneuver. Mild loss of side bending to the right and rotation. Grip strength and sensation normal in bilateral hands Strength good C4 to T1 distribution No sensory change to C4 to T1 Negative Hoffman sign bilaterally Reflexes normal  Osteopathic findings C2 flexed rotated and side bent right C4 flexed rotated and side bent left C7 flexed rotated and side bent left T3 extended rotated and side  bent right inhaled third rib T7 extended rotated and side bent left L2 flexed rotated and side bent right Sacrum right on right     Impression and Recommendations:     This case required medical decision making of moderate complexity.      Note: This dictation was prepared with Dragon dictation along with smaller phrase technology. Any transcriptional errors that result from this process are unintentional.

## 2017-04-23 NOTE — Assessment & Plan Note (Signed)
Dehiscence cervical disc disease. Still some mild limited range of motion. 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 Stones well to a supine manipulation. We'll do a week intervals.

## 2017-06-07 ENCOUNTER — Other Ambulatory Visit: Payer: Self-pay | Admitting: Family Medicine

## 2017-06-08 NOTE — Telephone Encounter (Signed)
Refill done.  

## 2017-06-23 ENCOUNTER — Encounter: Payer: Self-pay | Admitting: Family Medicine

## 2017-06-23 ENCOUNTER — Ambulatory Visit (INDEPENDENT_AMBULATORY_CARE_PROVIDER_SITE_OTHER): Payer: BLUE CROSS/BLUE SHIELD | Admitting: Family Medicine

## 2017-06-23 VITALS — BP 122/84 | HR 89 | Ht 64.0 in | Wt 202.0 lb

## 2017-06-23 DIAGNOSIS — M5030 Other cervical disc degeneration, unspecified cervical region: Secondary | ICD-10-CM | POA: Diagnosis not present

## 2017-06-23 DIAGNOSIS — M999 Biomechanical lesion, unspecified: Secondary | ICD-10-CM | POA: Diagnosis not present

## 2017-06-23 NOTE — Progress Notes (Signed)
[name] [place], [place] [address] Phone: [phone] Subjective:    I'm seeing this patient by the request  of:    CC:   [id number]  Lisa Humphrey is a 51 y.o. female coming in for follow up for back pain. She notes that her neck has been tight. She also wanted to know about her left pinky finger PIP. She believes that she has a cyst on that joint.   Onset-  Location Duration-  Character- Aggravating factors- Reliving factors-  Therapies tried-  Severity-     Past Medical History:  Diagnosis Date  . GERD (gastroesophageal reflux disease)   . Migraine   . Sleep apnea    CPAP  . Urticarial rash 06/02/2014   Past Surgical History:  Procedure Laterality Date  . WISDOM TOOTH EXTRACTION     Social History   Social History  . Marital status: Married    Spouse name: N/A  . Number of children: N/A  . Years of education: N/A   Social History Main Topics  . Smoking status: Never Smoker  . Smokeless tobacco: Never Used  . Alcohol use Yes     Comment: occasionally  . Drug use: No  . Sexual activity: Not on file   Other Topics Concern  . Not on file   Social History Narrative  . No narrative on file   Allergies  Allergen Reactions  . Codeine Nausea And Vomiting  . Eggs Or Egg-Derived Products Other (See Comments)    "Allergy test was positive years ago, I eat eggs" per pt.  . Hydrocodone Nausea And Vomiting  . Penicillins Rash   Family History  Problem Relation Age of Onset  . Hypertension Mother   . Heart disease Mother        BYPASS AND  A ABLATION  . Heart attack Mother   . Stroke Mother   . Colon polyps Mother   . Diverticulosis Mother   . Heart attack Father   . Heart disease Father        OPEN HEART SURGERY/HEART FAILURE/ PACEMAKER AND DEFIB   . Stroke Father   . Colon polyps Father   . Diverticulosis Father   . Hiatal hernia Father   . Colon polyps Brother   . Heart disease Sister        HEART  ATTACK AGE 31/ HE HAS A STENTX1/ CABG X4 VESSELS AT AGE 27 / AT AGE 50 BRAIN STEM STROKE  . Colon cancer Neg Hx   . Esophageal cancer Neg Hx   . Rectal cancer Neg Hx   . Stomach cancer Neg Hx      Past medical history, social, surgical and family history all reviewed in electronic medical record.  No pertanent information unless stated regarding to the chief complaint.   Review of Systems:Review of systems updated and as accurate as of 06/23/17  No headache, visual changes, nausea, vomiting, diarrhea, constipation, dizziness, abdominal pain, skin rash, fevers, chills, night sweats, weight loss, swollen lymph nodes, body aches, joint swelling, muscle aches, chest pain, shortness of breath, mood changes.   Objective  There were no vitals taken for this visit. Systems examined below as of 06/23/17   General: No apparent distress alert and oriented x3 mood and affect normal, dressed appropriately.  HEENT: Pupils equal, extraocular movements intact  Respiratory: Patient's speak in full sentences and does not appear short of breath  Cardiovascular: No lower extremity edema, non tender, no erythema  Skin: Warm dry intact with no signs of infection or rash on extremities or on axial skeleton.  Abdomen: Soft nontender  Neuro: Cranial nerves II through XII are intact, neurovascularly intact in all extremities with 2+ DTRs and 2+ pulses.  Lymph: No lymphadenopathy of posterior or anterior cervical chain or axillae bilaterally.  Gait normal with good balance and coordination.  MSK:  Non tender with full range of motion and good stability and symmetric strength and tone of shoulders, elbows, wrist, hip, knee and ankles bilaterally.     Impression and Recommendations:     This case required medical decision making of moderate complexity.      Note: This dictation was prepared with Dragon dictation along with smaller phrase technology. Any transcriptional errors that result from this process  are unintentional.

## 2017-06-23 NOTE — Patient Instructions (Signed)
Good to see you  Lisa Humphrey is your friend.  Stay active.  I am impressed See me again in 8-12 weeks.

## 2017-06-23 NOTE — Progress Notes (Signed)
[facility] [address] [place], [place] [address] Phone: [phone] Subjective:     CC: Neck pain follow-up  [id number]  Lisa Humphrey is a 51 y.o. female coming in with complaint of neck pain. Patient does have some tightness of the trapezius that likely secondary to muscle imbalances. Patient has responded very well to osteopathic manipulation in the past. Has been 2 months. Has been making progress. Patient states Still having tightness though. Patient states that can affect daily activities such as lifting. Since that she starts having decreased range of motion of her neck and she has the pain.      Past Medical History:  Diagnosis Date  . GERD (gastroesophageal reflux disease)   . Migraine   . Sleep apnea    CPAP  . Urticarial rash 06/02/2014   Past Surgical History:  Procedure Laterality Date  . WISDOM TOOTH EXTRACTION     Social History   Social History  . Marital status: Married    Spouse name: N/A  . Number of children: N/A  . Years of education: N/A   Social History Main Topics  . Smoking status: Never Smoker  . Smokeless tobacco: Never Used  . Alcohol use Yes     Comment: occasionally  . Drug use: No  . Sexual activity: Not on file   Other Topics Concern  . Not on file   Social History Narrative  . No narrative on file   Allergies  Allergen Reactions  . Codeine Nausea And Vomiting  . Eggs Or Egg-Derived Products Other (See Comments)    "Allergy test was positive years ago, I eat eggs" per pt.  . Hydrocodone Nausea And Vomiting  . Penicillins Rash   Family History  Problem Relation Age of Onset  . Hypertension Mother   . Heart disease Mother        BYPASS AND  A ABLATION  . Heart attack Mother   . Stroke Mother   . Colon polyps Mother   . Diverticulosis Mother   . Heart attack Father   . Heart disease Father        OPEN HEART SURGERY/HEART FAILURE/ PACEMAKER AND DEFIB   . Stroke Father   . Colon polyps  Father   . Diverticulosis Father   . Hiatal hernia Father   . Colon polyps Brother   . Heart disease Sister        HEART ATTACK AGE 42/ HE HAS A STENTX1/ CABG X4 VESSELS AT AGE 31 / AT AGE 55 BRAIN STEM STROKE  . Colon cancer Neg Hx   . Esophageal cancer Neg Hx   . Rectal cancer Neg Hx   . Stomach cancer Neg Hx      Past medical history, social, surgical and family history all reviewed in electronic medical record.  No pertanent information unless stated regarding to the chief complaint.   Review of Systems:Review of systems updated and as accurate as of 06/23/17  No headache, visual changes, nausea, vomiting, diarrhea, constipation, dizziness, abdominal pain, skin rash, fevers, chills, night sweats, weight loss, swollen lymph nodes, body aches, joint swelling, chest pain, shortness of breath, mood changes. Positive muscle aches  Objective  There were no vitals taken for this visit. Systems examined below as of 06/23/17   General: No apparent distress alert and oriented x3 mood and affect normal, dressed appropriately.  HEENT: Pupils equal, extraocular movements intact  Respiratory: Patient's speak in full sentences and does not appear short of  breath  Cardiovascular: No lower extremity edema, non tender, no erythema  Skin: Warm dry intact with no signs of infection or rash on extremities or on axial skeleton.  Abdomen: Soft nontender  Neuro: Cranial nerves II through XII are intact, neurovascularly intact in all extremities with 2+ DTRs and 2+ pulses.  Lymph: No lymphadenopathy of posterior or anterior cervical chain or axillae bilaterally.  Gait normal with good balance and coordination.  MSK:  Non tender with full range of motion and good stability and symmetric strength and tone of shoulders, elbows, wrist, hip, knee and ankles bilaterally.  Neck: Inspection loss of lordosis noted. No palpable stepoffs. Negative Spurling's maneuver. Lacking the last 10 of rotation and side  bending bilaterally significant muscle tightness and trapezius tightness bilaterally Grip strength and sensation normal in bilateral hands Strength good C4 to T1 distribution No sensory change to C4 to T1 Negative Hoffman sign bilaterally Reflexes normal  Osteopathic findings C2 flexed rotated and side bent right C4 flexed rotated and side bent left C7 flexed rotated and side bent left T6 extended rotated and side bent left L3 flexed rotated and side bent right Sacrum right on right     Impression and Recommendations:     This case required medical decision making of moderate complexity.      Note: This dictation was prepared with Dragon dictation along with smaller phrase technology. Any transcriptional errors that result from this process are unintentional.

## 2017-06-23 NOTE — Assessment & Plan Note (Signed)
Patient does have some arthritic changes. Doing well with conservative therapy. Responding well to manipulation. Continuing the Effexor at this point. We discussed icing regimen. Worsening symptoms consider other medications. Follow-up with me again in 8-12 weeks

## 2017-06-23 NOTE — Assessment & Plan Note (Signed)
Decision today to treat with OMT was based on Physical Exam  After verbal consent patient was treated with HVLA, ME, FPR techniques in cervical, thoracic, lumbar and sacral areas  Patient tolerated the procedure well with improvement in symptoms  Patient given exercises, stretches and lifestyle modifications  See medications in patient instructions if given  Patient will follow up in 8-12 weeks

## 2017-08-16 ENCOUNTER — Other Ambulatory Visit: Payer: Self-pay | Admitting: Family Medicine

## 2017-08-24 NOTE — Progress Notes (Signed)
[name] [place], [place] [address] Phone: [phone] Subjective:    I'm seeing this patient by the request  of:    CC: wrist pain, neck and back pain   JAS:[id number]  Rasa Degrazia is a 51 y.o. female coming in for follow up for cervical spine pain. She complains of her hands going numb mostly at night time for months. She does note that she sleeps with her arms overhead. She has been experiencing this with greater frequency recently. At work she also has numbness when holding the phone or if something puts pressure over the carpal tunnel area bilaterally. Patient is right handed.   Patient also has had neck pain.  Known to have degenerative disc disease previously.  Patient has responded very well to osteopathic manipulation.  Patient is having some mild increase in tightness in the upper back.    Past Medical History:  Diagnosis Date  . GERD (gastroesophageal reflux disease)   . Migraine   . Sleep apnea    CPAP  . Urticarial rash 06/02/2014   Past Surgical History:  Procedure Laterality Date  . WISDOM TOOTH EXTRACTION     Social History   Socioeconomic History  . Marital status: Married    Spouse name: None  . Number of children: None  . Years of education: None  . Highest education level: None  Social Needs  . Financial resource strain: None  . Food insecurity - worry: None  . Food insecurity - inability: None  . Transportation needs - medical: None  . Transportation needs - non-medical: None  Occupational History  . None  Tobacco Use  . Smoking status: Never Smoker  . Smokeless tobacco: Never Used  Substance and Sexual Activity  . Alcohol use: Yes    Comment: occasionally  . Drug use: No  . Sexual activity: None  Other Topics Concern  . None  Social History Narrative  . None   Allergies  Allergen Reactions  . Codeine Nausea And Vomiting  . Eggs Or Egg-Derived Products Other (See Comments)    "Allergy test  was positive years ago, I eat eggs" per pt.  . Hydrocodone Nausea And Vomiting  . Penicillins Rash   Family History  Problem Relation Age of Onset  . Hypertension Mother   . Heart disease Mother        BYPASS AND  A ABLATION  . Heart attack Mother   . Stroke Mother   . Colon polyps Mother   . Diverticulosis Mother   . Heart attack Father   . Heart disease Father        OPEN HEART SURGERY/HEART FAILURE/ PACEMAKER AND DEFIB   . Stroke Father   . Colon polyps Father   . Diverticulosis Father   . Hiatal hernia Father   . Colon polyps Brother   . Heart disease Sister        HEART ATTACK AGE 40/ HE HAS A STENTX1/ CABG X4 VESSELS AT AGE 40 / AT AGE 22 BRAIN STEM STROKE  . Colon cancer Neg Hx   . Esophageal cancer Neg Hx   . Rectal cancer Neg Hx   . Stomach cancer Neg Hx    Wt Readings from Last 3 Encounters:  08/25/17 207 lb (93.9 kg)  06/23/17 202 lb (91.6 kg)  04/23/17 200 lb (90.7 kg)     Past medical history, social, surgical and family history all reviewed in electronic medical record.  No pertanent information  unless stated regarding to the chief complaint.   Review of Systems:Review of systems updated and as accurate as of 08/25/17  No headache, visual changes, nausea, vomiting, diarrhea, constipation, dizziness, abdominal pain, skin rash, fevers, chills, night sweats, weight loss, swollen lymph nodes, body aches, joint swelling, muscle aches, chest pain, shortness of breath, mood changes.   Objective  Blood pressure 138/88, pulse 85, height 5\' 4"  (1.626 m), weight 207 lb (93.9 kg), SpO2 98 %. Systems examined below as of 08/25/17   General: No apparent distress alert and oriented x3 mood and affect normal, dressed appropriately.  HEENT: Pupils equal, extraocular movements intact  Respiratory: Patient's speak in full sentences and does not appear short of breath  Cardiovascular: No lower extremity edema, non tender, no erythema  Skin: Warm dry intact with no signs of  infection or rash on extremities or on axial skeleton.  Abdomen: Soft nontender patient is obese.  Significant weight gain since previous exam Neuro: Cranial nerves II through XII are intact, neurovascularly intact in all extremities with 2+ DTRs and 2+ pulses.  Lymph: No lymphadenopathy of posterior or anterior cervical chain or axillae bilaterally.  Gait normal with good balance and coordination.  MSK:  Non tender with full range of motion and good stability and symmetric strength and tone of shoulders, elbows, hip, knee and ankles bilaterally.   Wrist: Left Inspection normal with no visible erythema or swelling. ROM smooth and normal with good flexion and extension and ulnar/radial deviation that is symmetrical with opposite wrist. Palpation is normal over metacarpals, navicular, lunate, and TFCC; tendons without tenderness/ swelling No snuffbox tenderness. No tenderness over Canal of Guyon. Strength 5/5 in all directions without pain. Negative Finkelstein, positive Tinel's and phalens. Negative Watson's test.   Neck: Inspection unremarkable. No palpable stepoffs. Negative Spurling's maneuver. Patient does have some mild limitation with side bending and rotation bilaterally Grip strength and sensation normal in bilateral hands Strength good C4 to T1 distribution No sensory change to C4 to T1 Negative Hoffman sign bilaterally Reflexes normal  Osteopathic findings C2 flexed rotated and side bent right C4 flexed rotated and side bent left C7 flexed rotated and side bent left T3 extended rotated and side bent right inhaled third rib T6 extended rotated and side bent left L2 flexed rotated and side bent right Sacrum right on right    Impression and Recommendations:     This case required medical decision making of moderate complexity.      Note: This dictation was prepared with Dragon dictation along with smaller phrase technology. Any transcriptional errors that result  from this process are unintentional.

## 2017-08-25 ENCOUNTER — Ambulatory Visit: Payer: BLUE CROSS/BLUE SHIELD | Admitting: Family Medicine

## 2017-08-25 ENCOUNTER — Encounter: Payer: Self-pay | Admitting: Family Medicine

## 2017-08-25 VITALS — BP 138/88 | HR 85 | Ht 64.0 in | Wt 207.0 lb

## 2017-08-25 DIAGNOSIS — M999 Biomechanical lesion, unspecified: Secondary | ICD-10-CM

## 2017-08-25 DIAGNOSIS — E669 Obesity, unspecified: Secondary | ICD-10-CM | POA: Diagnosis not present

## 2017-08-25 DIAGNOSIS — E6609 Other obesity due to excess calories: Secondary | ICD-10-CM

## 2017-08-25 DIAGNOSIS — G5602 Carpal tunnel syndrome, left upper limb: Secondary | ICD-10-CM | POA: Diagnosis not present

## 2017-08-25 DIAGNOSIS — M542 Cervicalgia: Secondary | ICD-10-CM | POA: Diagnosis not present

## 2017-08-25 NOTE — Assessment & Plan Note (Signed)
Continues to be multifactorial.  Does have degenerative disc disease but responds very well to osteopathic manipulation.  We discussed posture ergonomics and lifting mechanics follow-up again in 8 weeks

## 2017-08-25 NOTE — Assessment & Plan Note (Signed)
Carpal Tunnel Syndrome: We discussed the anatomy involved, and that carpal tunnel syndrome primarily involves the median nerve, and this typically affects digits one through 3. We also discussed that mild cases of carpal tunnel syndrome are often improved with night splints, and it is very reasonable to consider a carpal tunnel injection. If the patient does have moderate to severe carpal tunnel syndrome based on NCV, then it is certainly reasonable to consider carpal tunnel release, which was discussed with the patient. We also discussed his severe carpal tunnel syndrome can lead to permanent nerve impairment even if released. At this point, the patient like to proceed conservatively.  

## 2017-08-25 NOTE — Patient Instructions (Signed)
Good to see you  Ice is your friend Stay active New exercises 3 times a week Continue the meds Brace at night (carpal tunnel splint) See me again in 8 weeks but send a message in 4 weeks

## 2017-08-25 NOTE — Assessment & Plan Note (Signed)
Decision today to treat with OMT was based on Physical Exam  After verbal consent patient was treated with HVLA, ME, FPR techniques in cervical, thoracic, lumbar and sacral areas  Patient tolerated the procedure well with improvement in symptoms  Patient given exercises, stretches and lifestyle modifications  See medications in patient instructions if given  Patient will follow up in 8 weeks 

## 2017-08-25 NOTE — Assessment & Plan Note (Signed)
Referred for weight management.

## 2017-08-31 ENCOUNTER — Other Ambulatory Visit: Payer: Self-pay | Admitting: Family Medicine

## 2017-10-20 ENCOUNTER — Ambulatory Visit: Payer: BLUE CROSS/BLUE SHIELD | Admitting: Family Medicine

## 2017-10-29 ENCOUNTER — Telehealth: Payer: Self-pay | Admitting: Family Medicine

## 2017-10-29 ENCOUNTER — Other Ambulatory Visit: Payer: Self-pay

## 2017-10-29 MED ORDER — VENLAFAXINE HCL ER 37.5 MG PO CP24: 37.5000 mg | ORAL_CAPSULE | Freq: Every day | ORAL | 2 refills | Status: AC

## 2017-10-29 NOTE — Telephone Encounter (Signed)
Copied from [place] [id number]. Topic: Quick Communication - See Telephone Encounter >> Oct 29, 2017  2:16 PM Burnis Medin, NT wrote: CRM for notification. See Telephone encounter for: Patient called and wanted to see if she can get her venlafaxine XR (EFFEXOR-XR) 37.5 MG 24 hr capsule prescription changed to mail order through [facility]  For a 90 day supply. Patient has already verified with her insurance.   10/29/17.

## 2018-01-05 ENCOUNTER — Other Ambulatory Visit: Payer: Self-pay | Admitting: Family Medicine

## 2018-08-19 ENCOUNTER — Telehealth: Payer: Self-pay | Admitting: *Deleted

## 2018-08-19 MED ORDER — FAMOTIDINE 20 MG PO TABS: 20.0000 mg | ORAL_TABLET | Freq: Two times a day (BID) | ORAL | 0 refills | Status: DC

## 2018-08-19 NOTE — Telephone Encounter (Signed)
Patient requesting famotidine in place of ranitidine due to cancer concerns found in recent studies.  Per Dr Hilarie Fredrickson, ok with this. Famotidine 20 mg twice daily in substitution of ranitidine 150 mg twice daily.

## 2018-10-25 ENCOUNTER — Other Ambulatory Visit: Payer: Self-pay | Admitting: Internal Medicine

## 2018-11-28 ENCOUNTER — Other Ambulatory Visit: Payer: Self-pay | Admitting: Internal Medicine

## 2018-12-10 ENCOUNTER — Telehealth: Payer: Self-pay | Admitting: *Deleted

## 2018-12-10 MED ORDER — FAMOTIDINE 20 MG PO TABS: 20.0000 mg | ORAL_TABLET | Freq: Two times a day (BID) | ORAL | 3 refills | Status: DC

## 2018-12-10 NOTE — Telephone Encounter (Signed)
Per Dr Hilarie Fredrickson, patient to receive refills on famotidine. Rx sent.

## 2019-09-14 ENCOUNTER — Other Ambulatory Visit: Payer: Self-pay

## 2019-09-14 ENCOUNTER — Encounter: Payer: Self-pay | Admitting: Family Medicine

## 2019-09-14 ENCOUNTER — Ambulatory Visit (INDEPENDENT_AMBULATORY_CARE_PROVIDER_SITE_OTHER): Payer: BC Managed Care – PPO | Admitting: Family Medicine

## 2019-09-14 DIAGNOSIS — M5412 Radiculopathy, cervical region: Secondary | ICD-10-CM | POA: Diagnosis not present

## 2019-09-14 MED ORDER — METHYLPREDNISOLONE ACETATE 80 MG/ML IJ SUSP
80.0000 mg | Freq: Once | INTRAMUSCULAR | Status: AC
  Administered 2019-09-14: 80 mg via INTRAMUSCULAR

## 2019-09-14 MED ORDER — GABAPENTIN 100 MG PO CAPS: 200.0000 mg | ORAL_CAPSULE | Freq: Every day | ORAL | 3 refills | Status: DC

## 2019-09-14 MED ORDER — PREDNISONE 50 MG PO TABS: ORAL_TABLET | ORAL | 0 refills | Status: AC

## 2019-09-14 MED ORDER — PREDNISONE 50 MG PO TABS: ORAL_TABLET | ORAL | 0 refills | Status: DC

## 2019-09-14 MED ORDER — KETOROLAC TROMETHAMINE 60 MG/2ML IM SOLN
60.0000 mg | Freq: Once | INTRAMUSCULAR | Status: AC
  Administered 2019-09-14: 16:00:00 60 mg via INTRAMUSCULAR

## 2019-09-14 NOTE — Addendum Note (Signed)
Addended by: Douglass Rivers T on: 09/14/2019 04:05 PM   Modules accepted: Orders

## 2019-09-14 NOTE — Assessment & Plan Note (Signed)
Cervical radiculopathy.  Patient started on gabapentin, prednisone, Toradol and Depo-Medrol given.  X-rays we will monitor.  Follow-up again in 2 to 3 weeks

## 2019-09-14 NOTE — Patient Instructions (Addendum)
  [address], [address] [place], [place] [address] Phone [phone]  Good to see you Prednisone Gabapentin See me again in 6 weeks

## 2019-09-14 NOTE — Progress Notes (Signed)
[name] [place], [place] [address] Phone: [phone] Subjective:   Fontaine No, am serving as a scribe for Dr. Hulan Saas. This visit occurred during the SARS-CoV-2 public health emergency.  Safety protocols were in place, including screening questions prior to the visit, additional usage of staff PPE, and extensive cleaning of exam room while observing appropriate contact time as indicated for disinfecting solutions.      CC: Neck pain, arm pain  [id number]  Lisa Humphrey is a 53 y.o. female coming in with complaint of neck pain and left scapula pain. Pain is radiating into left armpit. Is having pain into the left tricep. Feels like her strength is less in left hand. Pain began 3 weeks ago. Is using Mobic and IBU a night. Did have dizziness after last time she had OMT.   Also complaining of joint pain in cold weather. Is wondering if she has arthritis in patient since then seems to be worsening at the moment.  Can even be fairly difficult to find a comfortable position at night.  Patient denies though any weakness in the arm.  States that throughout the day seems to get somewhat worse though.    Past Medical History:  Diagnosis Date  . GERD (gastroesophageal reflux disease)   . Migraine   . Sleep apnea    CPAP  . Urticarial rash 06/02/2014   Past Surgical History:  Procedure Laterality Date  . WISDOM TOOTH EXTRACTION     Social History   Socioeconomic History  . Marital status: Married    Spouse name: Not on file  . Number of children: Not on file  . Years of education: Not on file  . Highest education level: Not on file  Occupational History  . Not on file  Tobacco Use  . Smoking status: Never Smoker  . Smokeless tobacco: Never Used  Substance and Sexual Activity  . Alcohol use: Yes    Comment: occasionally  . Drug use: No  . Sexual activity: Not on file  Other Topics Concern  . Not on file  Social History  Narrative  . Not on file   Social Determinants of Health   Financial Resource Strain:   . Difficulty of Paying Living Expenses: Not on file  Food Insecurity:   . Worried About Charity fundraiser in the Last Year: Not on file  . Ran Out of Food in the Last Year: Not on file  Transportation Needs:   . Lack of Transportation (Medical): Not on file  . Lack of Transportation (Non-Medical): Not on file  Physical Activity:   . Days of Exercise per Week: Not on file  . Minutes of Exercise per Session: Not on file  Stress:   . Feeling of Stress : Not on file  Social Connections:   . Frequency of Communication with Friends and Family: Not on file  . Frequency of Social Gatherings with Friends and Family: Not on file  . Attends Religious Services: Not on file  . Active Member of Clubs or Organizations: Not on file  . Attends Archivist Meetings: Not on file  . Marital Status: Not on file   Allergies  Allergen Reactions  . Codeine Nausea And Vomiting  . Eggs Or Egg-Derived Products Other (See Comments)    "Allergy test was positive years ago, I eat eggs" per pt.  . Hydrocodone Nausea And Vomiting  . Penicillins Rash   Family History  Problem  Relation Age of Onset  . Hypertension Mother   . Heart disease Mother        BYPASS AND  A ABLATION  . Heart attack Mother   . Stroke Mother   . Colon polyps Mother   . Diverticulosis Mother   . Heart attack Father   . Heart disease Father        OPEN HEART SURGERY/HEART FAILURE/ PACEMAKER AND DEFIB   . Stroke Father   . Colon polyps Father   . Diverticulosis Father   . Hiatal hernia Father   . Colon polyps Brother   . Heart disease Sister        HEART ATTACK AGE 51/ HE HAS A STENTX1/ CABG X4 VESSELS AT AGE 51 / AT AGE 67 BRAIN STEM STROKE  . Colon cancer Neg Hx   . Esophageal cancer Neg Hx   . Rectal cancer Neg Hx   . Stomach cancer Neg Hx     Current Outpatient Medications (Endocrine & Metabolic):  .  predniSONE  (DELTASONE) 50 MG tablet, 1 tablet by mouth daily  Current Outpatient Medications (Cardiovascular):  Marland Kitchen  EPINEPHrine (EPIPEN) 0.3 mg/0.3 mL DEVI, Inject 0.3 mLs (0.3 mg total) into the muscle as needed. .  verapamil (CALAN-SR) 180 MG CR tablet, Take 180 mg by mouth at bedtime. For miragrines  Current Outpatient Medications (Respiratory):  .  cetirizine (ZYRTEC) 10 MG tablet, Take 10 mg by mouth daily.   Current Outpatient Medications (Analgesics):  .  aspirin EC 81 MG tablet, Take 81 mg by mouth every morning. .  meloxicam (MOBIC) 15 MG tablet, TAKE 1 TABLET BY MOUTH EVERY DAY .  meloxicam (MOBIC) 15 MG tablet, TAKE 1 TABLET BY MOUTH EVERY DAY .  SUMAtriptan (IMITREX) 100 MG tablet, Take 100 mg by mouth every 2 (two) hours as needed for migraine.  Current Outpatient Medications (Hematological):  .  folic acid (FOLVITE) Q000111Q MCG tablet, Take 800 mcg by mouth daily.  .  vitamin B-12 (CYANOCOBALAMIN) 1000 MCG tablet, Take 1,000 mcg by mouth daily.  Current Outpatient Medications (Other):  Marland Kitchen  Cholecalciferol (VITAMIN D) 2000 units CAPS, Take 1 capsule by mouth daily. .  famotidine (PEPCID) 20 MG tablet, Take 1 tablet (20 mg total) by mouth 2 (two) times daily. .  Omega-3 Fatty Acids (FISH OIL DOUBLE STRENGTH) 1200 MG CAPS, Take 1 capsule by mouth 2 (two) times daily. 500 mg .  pyridOXINE (B-6) 50 MG tablet, Take 100 mg by mouth daily.  Marland Kitchen  venlafaxine XR (EFFEXOR-XR) 37.5 MG 24 hr capsule, Take 1 capsule (37.5 mg total) by mouth daily with breakfast. .  gabapentin (NEURONTIN) 100 MG capsule, Take 2 capsules (200 mg total) by mouth at bedtime.    Past medical history, social, surgical and family history all reviewed in electronic medical record.  No pertanent information unless stated regarding to the chief complaint.   Review of Systems:  No headache, visual changes, nausea, vomiting, diarrhea, constipation, dizziness, abdominal pain, skin rash, fevers, chills, night sweats, weight loss,  swollen lymph nodes, body aches, joint swelling,  chest pain, shortness of breath, mood changes.  Positive muscle aches  Objective  Blood pressure 110/84, pulse 78, height 5\' 4"  (1.626 m), weight 201 lb (91.2 kg), SpO2 98 %.    General: No apparent distress alert and oriented x3 mood and affect normal, dressed appropriately.  HEENT: Pupils equal, extraocular movements intact  Respiratory: Patient's speak in full sentences and does not appear short of breath  Cardiovascular: No  lower extremity edema, non tender, no erythema  Skin: Warm dry intact with no signs of infection or rash on extremities or on axial skeleton.  Abdomen: Soft nontender  Neuro: Cranial nerves II through XII are intact, neurovascularly intact in all extremities with 2+ DTRs and 2+ pulses.  Lymph: No lymphadenopathy of posterior or anterior cervical chain or axillae bilaterally.  Gait normal with good balance and coordination.  MSK:  Non tender with full range of motion and good stability and symmetric strength and tone of shoulders, elbows, wrist, hip, knee and ankles bilaterally.  Neck exam does have some loss of lordosis.  Positive Spurling's with radicular symptoms in the C7-C8 distribution on the left side.  Good grip strength noted.  Tightness noted in the parascapular region on the left side as well.  Very minimal impingement with the shoulder noted.    Impression and Recommendations:     This case required medical decision making of moderate complexity. The above documentation has been reviewed and is accurate and complete Lyndal Pulley, DO       Note: This dictation was prepared with Dragon dictation along with smaller phrase technology. Any transcriptional errors that result from this process are unintentional.

## 2019-10-06 ENCOUNTER — Telehealth: Payer: Self-pay | Admitting: Family Medicine

## 2019-10-06 ENCOUNTER — Other Ambulatory Visit: Payer: Self-pay

## 2019-10-06 DIAGNOSIS — M5030 Other cervical disc degeneration, unspecified cervical region: Secondary | ICD-10-CM

## 2019-10-06 NOTE — Telephone Encounter (Signed)
Cervical MRI would be the next order.  Do not have control over getting the scheduling done would need to call [facility] imaging.

## 2019-10-06 NOTE — Telephone Encounter (Signed)
Pt states she continues to have pain in left arm/hand and numbness in 3 fingers.  Pt also states she is having trouble holding her head up for lengthy period of time due to feeling of heaviness. Pt states she has continued to take steroids without much relief. Please advise. Pt left voicemail to sched appt.

## 2019-10-06 NOTE — Telephone Encounter (Signed)
Order sent to [facility]. Patient notified.

## 2019-10-06 NOTE — Telephone Encounter (Signed)
Spoke to pt, she stated she started taking the 5 day rx of prednisone that Dr. Tamala Julian sent in on 12/16. Pt stated the pharmacy called her to let her know there was a refill ready for her to pick up however Dr. Tamala Julian did not send in a refill of prednisone so pt started a second course of prednisone 50mg  on 10/03/19. She is in a lot of pain & asked if Dr. Tamala Julian would like to go ahead & order an MRI. Pt would like to have this done at [place].

## 2019-10-06 NOTE — Telephone Encounter (Signed)
Pt called back to let Ria Comment know if MRI could be scheduled tomorrow afternoon, Monday or next Thursday she could drive to [employer] those days. Please advise. She would like to do it in [place]

## 2019-10-08 ENCOUNTER — Ambulatory Visit
Admission: RE | Admit: 2019-10-08 | Discharge: 2019-10-08 | Disposition: A | Discharge status: Home / Self Care | Payer: BC Managed Care – PPO | Source: Ambulatory Visit | Attending: Family Medicine | Admitting: Family Medicine

## 2019-10-08 ENCOUNTER — Other Ambulatory Visit: Payer: Self-pay

## 2019-10-08 DIAGNOSIS — M5030 Other cervical disc degeneration, unspecified cervical region: Secondary | ICD-10-CM

## 2019-10-11 ENCOUNTER — Encounter: Payer: Self-pay | Admitting: Family Medicine

## 2019-10-21 ENCOUNTER — Ambulatory Visit: Payer: BC Managed Care – PPO | Admitting: Family Medicine

## 2019-10-21 ENCOUNTER — Other Ambulatory Visit: Payer: BC Managed Care – PPO

## 2019-10-25 ENCOUNTER — Ambulatory Visit (INDEPENDENT_AMBULATORY_CARE_PROVIDER_SITE_OTHER): Payer: BC Managed Care – PPO | Admitting: Neurology

## 2019-10-25 ENCOUNTER — Ambulatory Visit: Payer: BC Managed Care – PPO | Admitting: Neurology

## 2019-10-25 ENCOUNTER — Other Ambulatory Visit: Payer: Self-pay

## 2019-10-25 DIAGNOSIS — M5412 Radiculopathy, cervical region: Secondary | ICD-10-CM | POA: Diagnosis not present

## 2019-10-25 DIAGNOSIS — G5622 Lesion of ulnar nerve, left upper limb: Secondary | ICD-10-CM | POA: Diagnosis not present

## 2019-10-25 DIAGNOSIS — Z0289 Encounter for other administrative examinations: Secondary | ICD-10-CM

## 2019-10-25 NOTE — Progress Notes (Signed)
See procedure note.

## 2019-10-26 NOTE — Progress Notes (Signed)
Full Name: Lisa Humphrey Gender: Female MRN #: [id number] Date of Birth: 1966-04-09    Visit Date: 10/25/2019 10:23 Age: 54 Years Examining Physician: Sarina Ill, MD  Referring Physician: Erline Levine, MD  History: This is a 54 year old patient here for evaluation at the request ofJoseph Vertell Humphrey for neck pain, cervical radiculopathy and ulnar neuropathy.   Summary: EMG nerve conduction study was performed in the bilateral upper extremities. Evaluation of the left Ulnar motor nerve(recording at the ADM) showed  a 74m/s drop across the elbow section (N<55m/s).  Evaluation of the left Ulnar motor nerve(recording at the FDI) showed  a 43m/s drop across the elbow section (N<24m/s). All remaining nerves (as indicated in the following tables) were within normal limits.  The left triceps muscle showed diminished motor unit recruitment, the left extensor indices, left extensor digitorum, left abductor digit and first dorsal interosseous muscles showed increased motor unit amplitude and diminished motor unit recruitment.  The left flexor digitorum profundus showed increased spontaneous activity (3+ positive waves) and reduced motor unit recruitment.  The left lower cervical paraspinal muscles showed increased spontaneous activity (3+ positive waves).All remaining muscles (as indicated in the following tables) were within normal limits.     Conclusion:  1.  There is acute/ongoing denervation in the left lower cervical paraspinal muscles in addition to chronic neurogenic changes in distal muscles that share C7 innervation consistent with cervical radiculopathy.  2. 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   Cc: Dr. Charm Barges M.D.  [facility] [place], [place] [address] Tel: [phone] Fax: [phone]         [facility]    Nerve / Sites Muscle Latency Ref. Amplitude Ref. Rel Amp Segments Distance Velocity Ref. Area    ms ms mV mV %  cm m/s m/s mVms  L Median - APB     Wrist APB 4.1 ?4.4 7.3 ?4.0 100 Wrist - APB 7   23.6     Upper arm APB 7.9  7.8  107 Upper arm - Wrist 20 54 ?49 25.2  R Median - APB     Wrist APB 3.6 ?4.4 4.5 ?4.0 100 Wrist - APB 7   12.1     Upper arm APB 7.3  4.3  96.6 Upper arm - Wrist 20 54 ?49 11.9  L Ulnar - ADM     Wrist ADM 2.9 ?3.3 8.2 ?6.0 100 Wrist - ADM 7   20.9     B.Elbow ADM 5.3  7.9  95.7 B.Elbow - Wrist 17 70 ?49 18.2     A.Elbow ADM 7.2  8.0  102 A.Elbow - B.Elbow 10 53 ?49 17.6         A.Elbow - Wrist      R Ulnar - ADM     Wrist ADM 2.4 ?3.3 11.7 ?6.0 100 Wrist - ADM 7   28.6     B.Elbow ADM 5.1  10.0  85.5 B.Elbow - Wrist 18 67 ?49 25.3     A.Elbow ADM 6.6  9.9  98.9 A.Elbow - B.Elbow 10 67 ?49 25.3         A.Elbow - Wrist      L Ulnar - FDI     Wrist FDI 3.8 ?  4.5 9.9 ?7.0 100 Wrist - FDI 8   18.4     B.Elbow FDI 6.4  9.9  100 B.Elbow - Wrist 18 70 ?49 18.3     A.Elbow FDI 8.2  10.3  104 A.Elbow - B.Elbow 10 56 ?49 19.5         A.Elbow - Wrist                   SNC    Nerve / Sites Rec. Site Peak Lat Ref.  Amp Ref. Segments Distance Peak Diff Ref.    ms ms V V  cm ms ms  L Median, Ulnar - Transcarpal comparison     Median Palm Wrist 2.3 ?2.2 146 ?35 Median Palm - Wrist 8       Ulnar Palm Wrist 2.0 ?2.2 53 ?12 Ulnar Palm - Wrist 8          Median Palm - Ulnar Palm  0.3 ?0.4  R Median, Ulnar - Transcarpal comparison     Median Palm Wrist 2.2 ?2.2 94 ?35 Median Palm - Wrist 8       Ulnar Palm Wrist 1.8 ?2.2 78 ?12 Ulnar Palm - Wrist 8          Median Palm - Ulnar Palm  0.4 ?0.4  L Median - Orthodromic (Dig II, Mid palm)     Dig II Wrist 3.2 ?3.4 20 ?10 Dig II - Wrist 13    R Median - Orthodromic (Dig II, Mid palm)     Dig II Wrist 3.2 ?3.4 11  ?10 Dig II - Wrist 13    L Ulnar - Orthodromic, (Dig V, Mid palm)     Dig V Wrist 2.7 ?3.1 10 ?5 Dig V - Wrist 11    R Ulnar - Orthodromic, (Dig V, Mid palm)     Dig V Wrist 2.5 ?3.1 14 ?5 Dig V - Wrist 44                   F  Wave    Nerve F Lat Ref.   ms ms  L Ulnar - ADM 26.1 ?32.0  R Ulnar - ADM 25.2 ?32.0         EMG Summary Table    Spontaneous MUAP Recruitment  Muscle IA Fib PSW Fasc Other Amp Dur. Poly Pattern  L. Deltoid Normal None None None _______ Normal Normal Normal Normal  L. Triceps brachii Normal None None None _______ Normal Normal Normal Reduced  L. Biceps brachii Normal None None None _______ Normal Normal Normal Normal  L. Pronator teres Normal None None None _______ Normal Normal Normal Normal  Extensor Digitorum Normal None None None _______ Increased Normal Normal Reduced  Extensor Indicis Normal None None None _______ Increased Normal Normal Reduced  Abductor Digiti Normal None None None _______ Increased Normal Normal Reduced  L. First dorsal interosseous Normal None None None _______ Increased Normal Normal Reduced  L. Flexor digitorum profundus (Ulnar) Normal None 3+ None _______ Normal Normal Normal Reduced  L. Opponens pollicis Normal None None None _______ Normal Normal Normal Normal  L. Cervical paraspinals (low) Normal None 3+ None _______ Normal Normal Normal Normal

## 2019-10-27 NOTE — Procedures (Signed)
Full Name: Lisa Humphrey Gender: Female MRN #: [id number] Date of Birth: [birth year]    Visit Date: 10/25/2019 10:23 Age: 54 Years Examining Physician: Sarina Ill, MD  Referring Physician: Erline Levine, MD  History: This is a 54 year old patient here for evaluation at the request ofJoseph Vertell Limber for neck pain, cervical radiculopathy and ulnar neuropathy.   Summary: EMG nerve conduction study was performed in the bilateral upper extremities. Evaluation of the left Ulnar motor nerve(recording at the ADM) showed  a 16m/s drop across the elbow section (N<23m/s).  Evaluation of the left Ulnar motor nerve(recording at the FDI) showed  a 37m/s drop across the elbow section (N<49m/s). All remaining nerves (as indicated in the following tables) were within normal limits.  The left triceps muscle showed diminished motor unit recruitment, the left extensor indices, left extensor digitorum, left abductor digit and first dorsal interosseous muscles showed increased motor unit amplitude and diminished motor unit recruitment.  The left flexor digitorum profundus showed increased spontaneous activity (3+ positive waves) and reduced motor unit recruitment.  The left lower cervical paraspinal muscles showed increased spontaneous activity (3+ positive waves).All remaining muscles (as indicated in the following tables) were within normal limits.     Conclusion:  1.  There is acute/ongoing denervation in the left lower cervical paraspinal muscles in addition to chronic neurogenic changes in distal muscles that share C7 innervation consistent with cervical radiculopathy.  2. 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   Cc: Dr. Charm Barges M.D.  [facility] [place], [place] [address] Tel: [phone] Fax: [phone]         [facility]    Nerve / Sites Muscle Latency Ref. Amplitude Ref. Rel Amp Segments Distance Velocity Ref. Area    ms ms mV mV %  cm m/s m/s mVms  L Median - APB     Wrist APB 4.1 ?4.4 7.3 ?4.0 100 Wrist - APB 7   23.6     Upper arm APB 7.9  7.8  107 Upper arm - Wrist 20 54 ?49 25.2  R Median - APB     Wrist APB 3.6 ?4.4 4.5 ?4.0 100 Wrist - APB 7   12.1     Upper arm APB 7.3  4.3  96.6 Upper arm - Wrist 20 54 ?49 11.9  L Ulnar - ADM     Wrist ADM 2.9 ?3.3 8.2 ?6.0 100 Wrist - ADM 7   20.9     B.Elbow ADM 5.3  7.9  95.7 B.Elbow - Wrist 17 70 ?49 18.2     A.Elbow ADM 7.2  8.0  102 A.Elbow - B.Elbow 10 53 ?49 17.6         A.Elbow - Wrist      R Ulnar - ADM     Wrist ADM 2.4 ?3.3 11.7 ?6.0 100 Wrist - ADM 7   28.6     B.Elbow ADM 5.1  10.0  85.5 B.Elbow - Wrist 18 67 ?49 25.3     A.Elbow ADM 6.6  9.9  98.9 A.Elbow - B.Elbow 10 67 ?49 25.3         A.Elbow - Wrist      L Ulnar - FDI     Wrist FDI 3.8 ?  4.5 9.9 ?7.0 100 Wrist - FDI 8   18.4     B.Elbow FDI 6.4  9.9  100 B.Elbow - Wrist 18 70 ?49 18.3     A.Elbow FDI 8.2  10.3  104 A.Elbow - B.Elbow 10 56 ?49 19.5         A.Elbow - Wrist                   SNC    Nerve / Sites Rec. Site Peak Lat Ref.  Amp Ref. Segments Distance Peak Diff Ref.    ms ms V V  cm ms ms  L Median, Ulnar - Transcarpal comparison     Median Palm Wrist 2.3 ?2.2 146 ?35 Median Palm - Wrist 8       Ulnar Palm Wrist 2.0 ?2.2 53 ?12 Ulnar Palm - Wrist 8          Median Palm - Ulnar Palm  0.3 ?0.4  R Median, Ulnar - Transcarpal comparison     Median Palm Wrist 2.2 ?2.2 94 ?35 Median Palm - Wrist 8       Ulnar Palm Wrist 1.8 ?2.2 78 ?12 Ulnar Palm - Wrist 8          Median Palm - Ulnar Palm  0.4 ?0.4  L Median - Orthodromic (Dig II, Mid palm)     Dig II Wrist 3.2 ?3.4 20 ?10 Dig II - Wrist 13    R Median - Orthodromic (Dig II, Mid palm)     Dig II Wrist 3.2 ?3.4 11  ?10 Dig II - Wrist 13    L Ulnar - Orthodromic, (Dig V, Mid palm)     Dig V Wrist 2.7 ?3.1 10 ?5 Dig V - Wrist 11    R Ulnar - Orthodromic, (Dig V, Mid palm)     Dig V Wrist 2.5 ?3.1 14 ?5 Dig V - Wrist 66                   F  Wave    Nerve F Lat Ref.   ms ms  L Ulnar - ADM 26.1 ?32.0  R Ulnar - ADM 25.2 ?32.0         EMG Summary Table    Spontaneous MUAP Recruitment  Muscle IA Fib PSW Fasc Other Amp Dur. Poly Pattern  L. Deltoid Normal None None None _______ Normal Normal Normal Normal  L. Triceps brachii Normal None None None _______ Normal Normal Normal Reduced  L. Biceps brachii Normal None None None _______ Normal Normal Normal Normal  L. Pronator teres Normal None None None _______ Normal Normal Normal Normal  Extensor Digitorum Normal None None None _______ Increased Normal Normal Reduced  Extensor Indicis Normal None None None _______ Increased Normal Normal Reduced  Abductor Digiti Normal None None None _______ Increased Normal Normal Reduced  L. First dorsal interosseous Normal None None None _______ Increased Normal Normal Reduced  L. Flexor digitorum profundus (Ulnar) Normal None 3+ None _______ Normal Normal Normal Reduced  L. Opponens pollicis Normal None None None _______ Normal Normal Normal Normal  L. Cervical paraspinals (low) Normal None 3+ None _______ Normal Normal Normal Normal

## 2019-10-27 NOTE — Patient Instructions (Signed)
Cubital Tunnel Syndrome  Cubital tunnel syndrome is a condition that causes pain and weakness of the forearm and hand. It happens when one of the nerves that runs along the inside of the elbow joint (ulnar nerve) becomes irritated. This condition is usually caused by repeated arm motions that are done during sports or work-related activities. What are the causes? This condition may be caused by:  Increased pressure on the ulnar nerve at the elbow, arm, or forearm. This can result from: ? Irritation caused by repeated elbow bending. ? Poorly healed elbow fractures. ? Tumors in the elbow. These are usually noncancerous (benign). ? Scar tissue that develops in the elbow after an injury. ? Bony growths (spurs) near the ulnar nerve.  Stretching of the nerve due to loose elbow ligaments.  Trauma to the nerve at the elbow. What increases the risk? The following factors may make you more likely to develop this condition:  Doing manual labor that requires frequent bending of the elbow.  Playing sports that include repeated or strenuous throwing motions, such as baseball.  Playing contact sports, such as football or lacrosse.  Not warming up properly before activities.  Having diabetes.  Having an underactive thyroid (hypothyroidism). What are the signs or symptoms? Symptoms of this condition include:  Clumsiness or weakness of the hand.  Tenderness of the inner elbow.  Aching or soreness of the inner elbow, forearm, or fingers, especially the little finger or the ring finger.  Increased pain when forcing the elbow to bend.  Reduced control when throwing objects.  Tingling, numbness, or a burning feeling inside the forearm or in part of the hand or fingers, especially the little finger or the ring finger.  Sharp pains that shoot from the elbow down to the wrist and hand.  The inability to grip or pinch hard. How is this diagnosed? This condition is diagnosed based on:  Your  symptoms and medical history. Your health care provider will also ask for details about any injury.  A physical exam. You may also have tests, including:  Electromyogram (EMG). This test measures electrical signals sent by your nerves into the muscles.  Nerve conduction study. This test measures how well electrical signals pass through your nerves.  Imaging tests, such as X-rays, ultrasound, and MRI. These tests check for possible causes of your condition. How is this treated? This condition may be treated by:  Stopping the activities that are causing your symptoms to get worse.  Icing and taking medicines to reduce pain and swelling.  Wearing a splint to prevent your elbow from bending, or wearing an elbow pad where the ulnar nerve is closest to the skin.  Working with a physical therapist in less severe cases. This may help to: ? Decrease your symptoms. ? Improve the strength and range of motion of your elbow, forearm, and hand. If these treatments do not help, surgery may be needed. Follow these instructions at home: If you have a splint:  Wear the splint as told by your health care provider. Remove it only as told by your health care provider.  Loosen the splint if your fingers tingle, become numb, or turn cold and blue.  Keep the splint clean.  If the splint is not waterproof: ? Do not let it get wet. ? Cover it with a watertight covering when you take a bath or shower. Managing pain, stiffness, and swelling   If directed, put ice on the injured area: ? Put ice in a plastic bag. ?   Place a towel between your skin and the bag. ? Leave the ice on for 20 minutes, 2-3 times a day.  Move your fingers often to avoid stiffness and to lessen swelling.  Raise (elevate) the injured area above the level of your heart while you are sitting or lying down. General instructions  Take over-the-counter and prescription medicines only as told by your health care provider.  Do any  exercise or physical therapy as told by your health care provider.  Do not drive or use heavy machinery while taking prescription pain medicine.  If you were given an elbow pad, wear it as told by your health care provider.  Keep all follow-up visits as told by your health care provider. This is important. Contact a health care provider if:  Your symptoms get worse.  Your symptoms do not get better with treatment.  You have new pain.  Your hand on the injured side feels numb or cold. Summary  Cubital tunnel syndrome is a condition that causes pain and weakness of the forearm and hand.  You are more likely to develop this condition if you do work or play sports that involve repeated arm movements.  This condition is often treated by stopping repetitive activities, applying ice, and using anti-inflammatory medicines.  In rare cases, surgery may be needed. This information is not intended to replace advice given to you by your health care provider. Make sure you discuss any questions you have with your health care provider. Document Revised: 02/01/2018 Document Reviewed: 02/01/2018 Elsevier Patient Education  [employer]. Cervical Radiculopathy  Cervical radiculopathy means that a nerve in the neck (a cervical nerve) is pinched or bruised. This can happen because of an injury to the cervical spine (vertebrae) in the neck, or as a normal part of getting older. This can cause pain or loss of feeling (numbness) that runs from your neck all the way down to your arm and fingers. Often, this condition gets better with rest. Treatment may be needed if the condition does not get better. What are the causes?  A neck injury.  A bulging disk in your spine.  Muscle movements that you cannot control (muscle spasms).  Tight muscles in your neck due to overuse.  Arthritis.  Breakdown in the bones and joints of the spine (spondylosis) due to getting older.  Bone spurs that form near  the nerves in the neck. What are the signs or symptoms?  Pain. The pain may: ? Run from the neck to the arm and hand. ? Be very bad or irritating. ? Be worse when you move your neck.  Loss of feeling or tingling in your arm or hand.  Weakness in your arm or hand, in very bad cases. How is this treated? In many cases, treatment is not needed for this condition. With rest, the condition often gets better over time. If treatment is needed, options may include:  Wearing a soft neck collar (cervical collar) for short periods of time, as told by your doctor.  Doing exercises (physical therapy) to strengthen your neck muscles.  Taking medicines.  Having shots (injections) in your spine, in very bad cases.  Having surgery. This may be needed if other treatments do not help. The type of surgery that is used depends on the cause of your condition. Follow these instructions at home: If you have a soft neck collar:  Wear it as told by your doctor. Remove it only as told by your doctor.  Ask  your doctor if you can remove the collar for cleaning and bathing. If you are allowed to remove the collar for cleaning or bathing: ? Follow instructions from your doctor about how to remove the collar safely. ? Clean the collar by wiping it with mild soap and water and drying it completely. ? Take out any removable pads in the collar every 1-2 days. Wash them by hand with soap and water. Let them air-dry completely before you put them back in the collar. ? Check your skin under the collar for redness or sores. If you see any, tell your doctor. Managing pain      Take over-the-counter and prescription medicines only as told by your doctor.  If told, put ice on the painful area. ? If you have a soft neck collar, remove it as told by your doctor. ? Put ice in a plastic bag. ? Place a towel between your skin and the bag. ? Leave the ice on for 20 minutes, 2-3 times a day.  If using ice does not  help, you can try using heat. Use the heat source that your doctor recommends, such as a moist heat pack or a heating pad. ? Place a towel between your skin and the heat source. ? Leave the heat on for 20-30 minutes. ? Remove the heat if your skin turns bright red. This is very important if you are unable to feel pain, heat, or cold. You may have a greater risk of getting burned.  You may try a gentle neck and shoulder rub (massage). Activity  Rest as needed.  Return to your normal activities as told by your doctor. Ask your doctor what activities are safe for you.  Do exercises as told by your doctor or physical therapist.  Do not lift anything that is heavier than 10 lb (4.5 kg) until your doctor tells you that it is safe. General instructions  Use a flat pillow when you sleep.  Do not drive while wearing a soft neck collar. If you do not have a soft neck collar, ask your doctor if it is safe to drive while your neck heals.  Ask your doctor if the medicine prescribed to you requires you to avoid driving or using heavy machinery.  Do not use any products that contain nicotine or tobacco, such as cigarettes, e-cigarettes, and chewing tobacco. These can delay healing. If you need help quitting, ask your doctor.  Keep all follow-up visits as told by your doctor. This is important. Contact a doctor if:  Your condition does not get better with treatment. Get help right away if:  Your pain gets worse and is not helped with medicine.  You lose feeling or feel weak in your hand, arm, face, or leg.  You have a high fever.  You have a stiff neck.  You cannot control when you poop or pee (have incontinence).  You have trouble with walking, balance, or talking. Summary  Cervical radiculopathy means that a nerve in the neck is pinched or bruised.  A nerve can get pinched from a bulging disk, arthritis, an injury to the neck, or other causes.  Symptoms include pain, tingling, or  loss of feeling that goes from the neck into the arm or hand.  Weakness in your arm or hand can happen in very bad cases.  Treatment may include resting, wearing a soft neck collar, and doing exercises. You might need to take medicines for pain. In very bad cases, shots or surgery  may be needed. This information is not intended to replace advice given to you by your health care provider. Make sure you discuss any questions you have with your health care provider. Document Revised: 08/06/2018 Document Reviewed: 08/06/2018 Elsevier Patient Education  2020 [employer].

## 2019-12-23 ENCOUNTER — Other Ambulatory Visit: Payer: Self-pay | Admitting: Internal Medicine

## 2020-02-20 ENCOUNTER — Other Ambulatory Visit: Payer: Self-pay | Admitting: Internal Medicine

## 2020-02-20 NOTE — Telephone Encounter (Signed)
yes

## 2020-02-20 NOTE — Telephone Encounter (Signed)
Continue famotidine refills?

## 2020-04-25 ENCOUNTER — Telehealth: Payer: Self-pay | Admitting: Internal Medicine

## 2020-04-25 IMAGING — MR MR CERVICAL SPINE W/O CM
4 of 5 series · 27 of 48 positions shown · non-contrast
Comparison: September 25, 2016

CLINICAL DATA: Neck pain numbness and tingling

EXAM:
MRI CERVICAL SPINE WITHOUT CONTRAST
TECHNIQUE: Multiplanar, multisequence MR imaging of the cervical spine was
performed. No intravenous contrast was administered.

[Series 3: T2 · sagittal · 3.0mm · 0.66mm/px · 6 of 13 slices shown (1 of 2)]
[im 1/13]
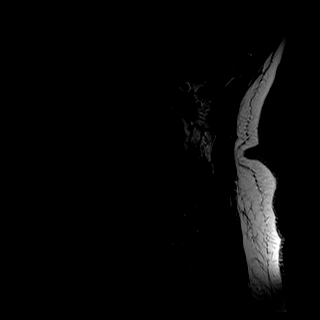
[im 3/13]
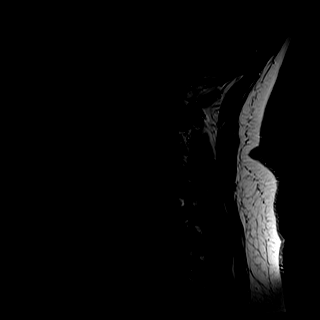
[im 5/13]
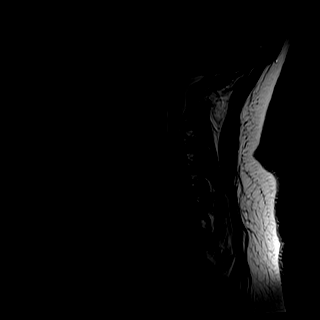
[im 8/13]
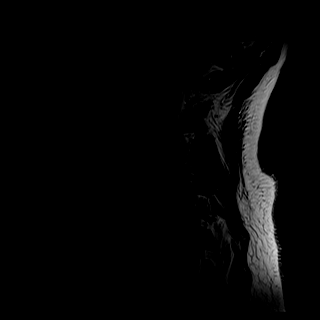
[im 10/13]
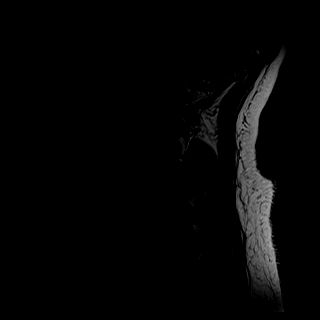
[im 13/13]
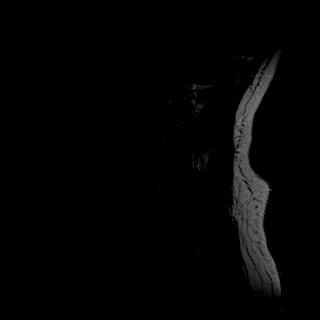

[Series 4: STIR · sagittal · 3.0mm · 0.41mm/px · 6 of 13 slices shown]
[im 1/13]
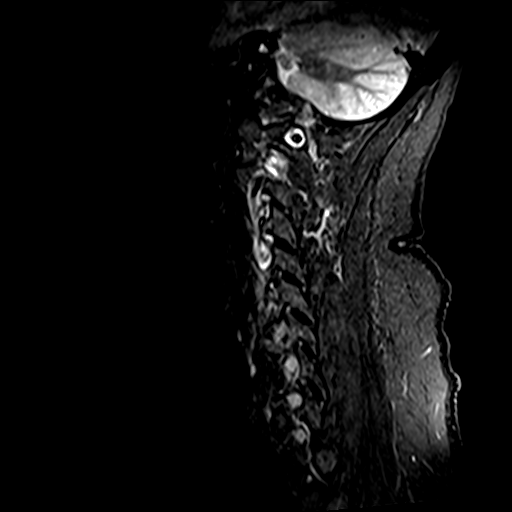
[im 3/13]
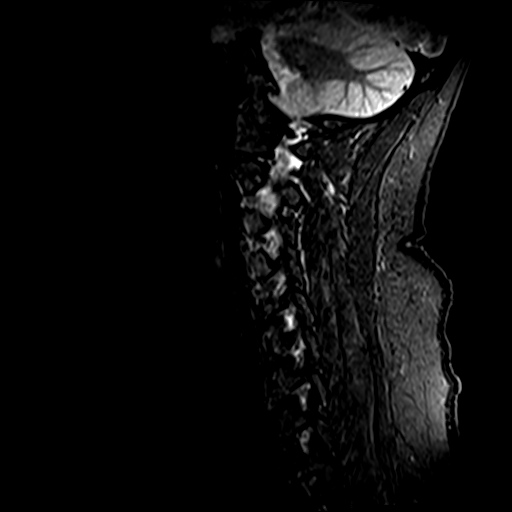
[im 5/13]
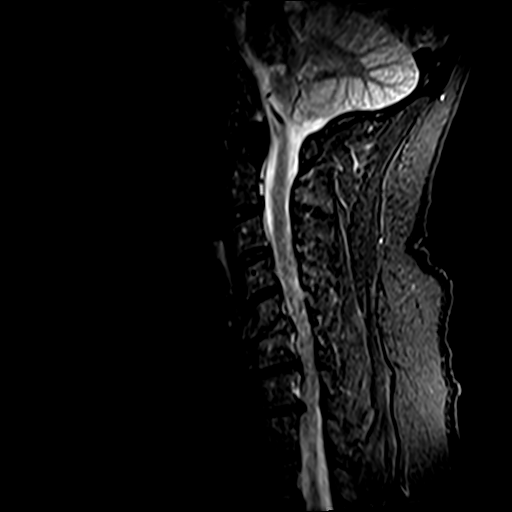
[im 7/13]
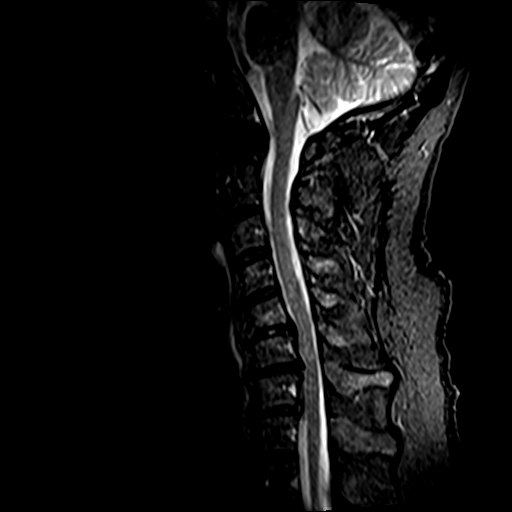
[im 9/13]
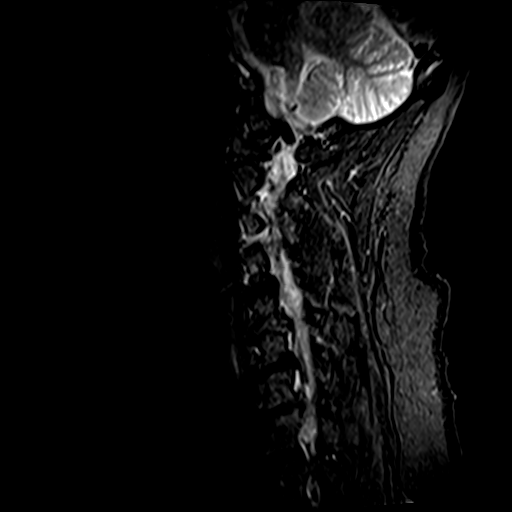
[im 11/13]
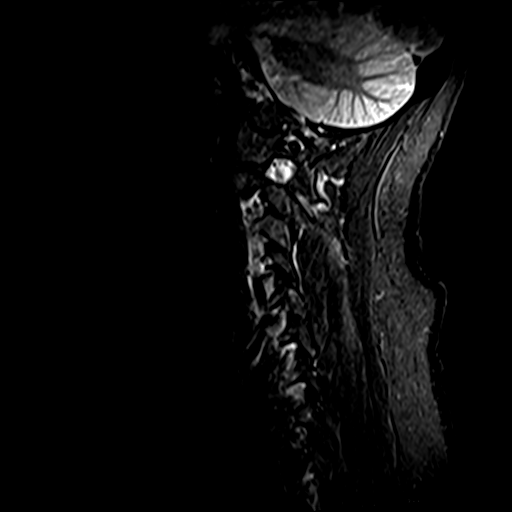

[Series 5: T1 · sagittal · 3.0mm · 0.41mm/px · 7 of 13 slices shown]
[im 1/13]
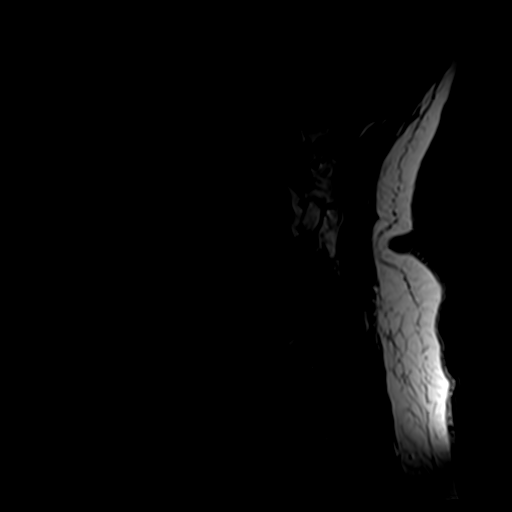
[im 3/13]
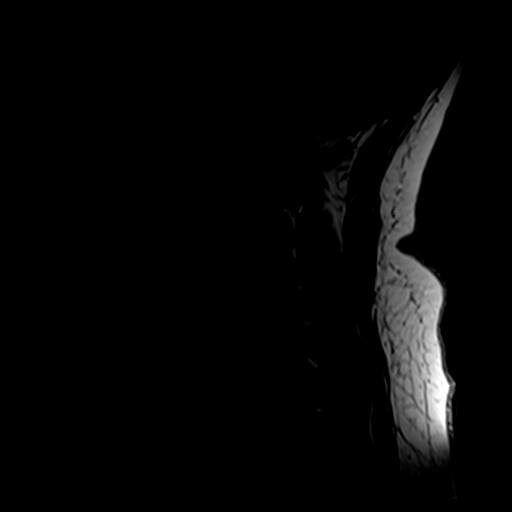
[im 5/13]
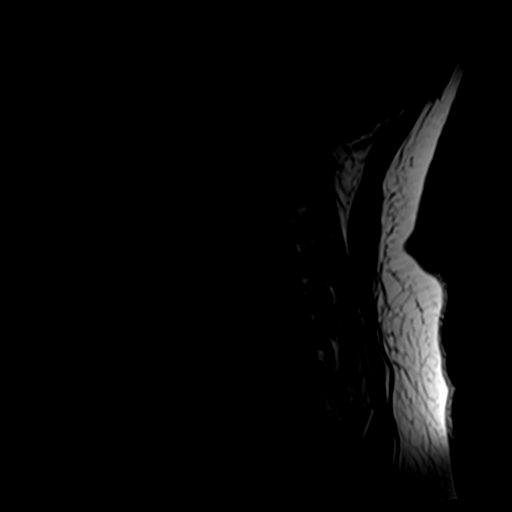
[im 7/13]
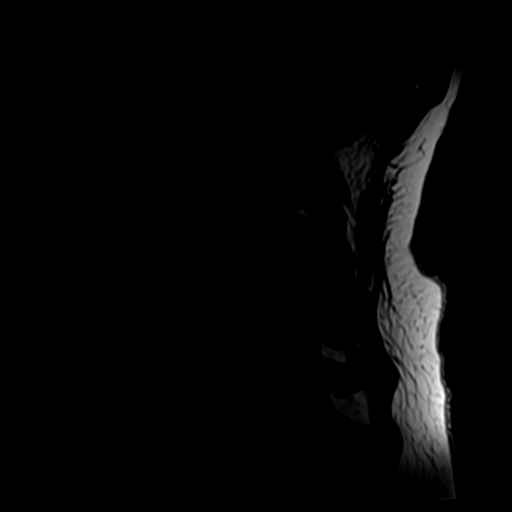
[im 9/13]
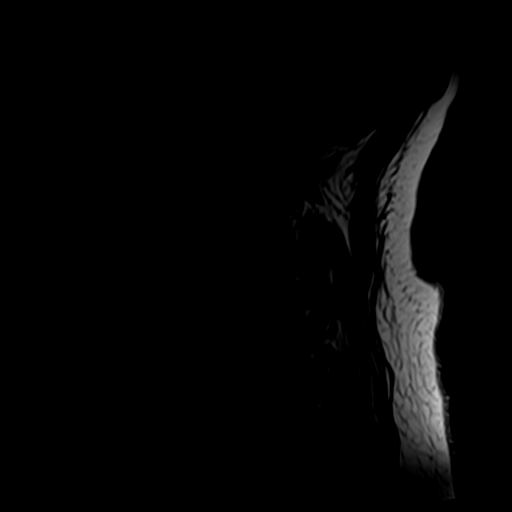
[im 11/13]
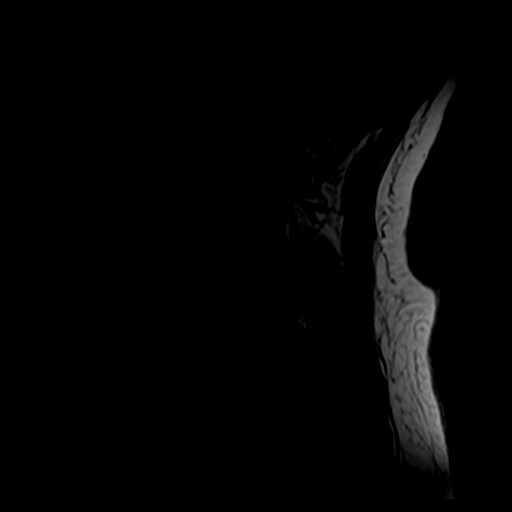
[im 13/13]
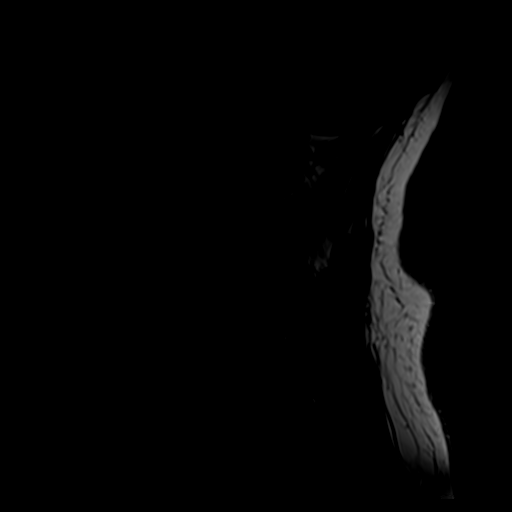

[Series 7: T2 · axial · 3.0mm · 0.70mm/px · z∈[-65,+30]mm · 8 of 28 slices shown (2 of 2)]
[im 1/28]
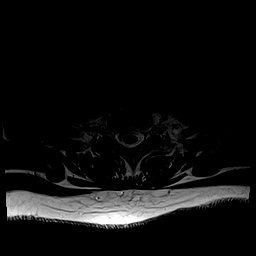
[im 5/28]
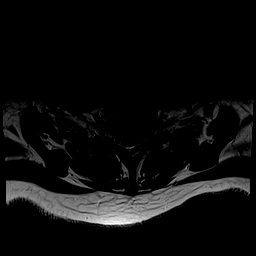
[im 9/28]
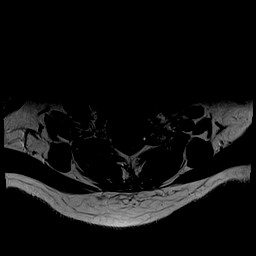
[im 13/28]
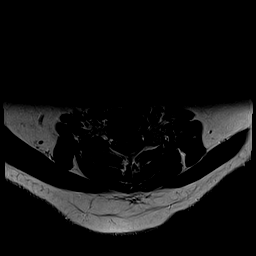
[im 15/28]
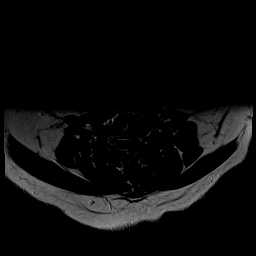
[im 19/28]
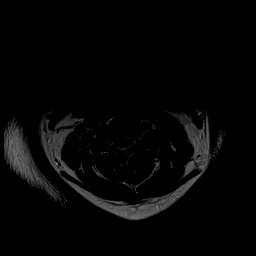
[im 23/28]
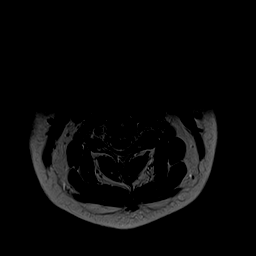
[im 28/28]
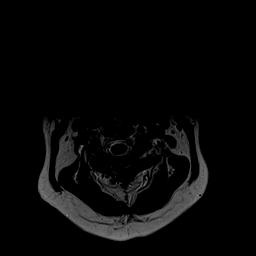

[27 of 48 positions shown; findings below may reference images not displayed]

FINDINGS: Alignment: Slight reversal of the normal cervical lordosis.

Vertebrae: The vertebral body heights are well maintained. No
fracture, marrow edema,or pathologic marrow infiltration.

Cord: Normal signal and morphology.

Posterior Fossa, vertebral arteries, paraspinal tissues:

The visualized portion of the posterior fossa is unremarkable.
Normal flow voids seen within the vertebral arteries. The paraspinal
soft tissues are unremarkable.

Disc levels:

C1-C2: No significant canal or neural foraminal narrowing.

C2-C3: There is a minimal disc osteophyte complex with a tiny
central disc protrusion with mild left neural foraminal narrowing.

C3-C4: There is a minimal disc osteophyte complex and uncovertebral
osteophytes which causes mild left neural foraminal narrowing.

C4-C5: There is a minimal disc osteophyte complex and uncovertebral
osteophytes which causes mild-to-moderate left neural foraminal
narrowing and mild central canal stenosis.

C5-C6: There is a disc osteophyte complex and a central disc
protrusion with uncovertebral osteophytes which causes severe left
and moderate right neural foraminal narrowing. The central thecal
sac measures 7 mm in AP diameter.

C6-C7: There is a disc osteophyte complex and uncovertebral
osteophytes with a small central disc protrusion which causes severe
left and moderate right neural foraminal narrowing. The central
thecal sac measures 6 mm in AP diameter.

C7-T1: Disc osteophyte complex and uncovertebral osteophytes which
causes severe left and moderate to severe right neural foraminal
narrowing and mild central canal stenosis.
IMPRESSION: Slight reversal of the normal cervical lordosis.

Cervical spine spondylosis most notable at C5-C6 with severe left
neural foraminal narrowing and mild to moderate central canal
stenosis and at C6-C7 with severe left neural foraminal narrowing
and moderate central canal stenosis.

## 2020-04-25 NOTE — Telephone Encounter (Signed)
Patient discussed with me changing famotidine to PPI.  She is being evaluated by neurology for dementia/memory loss and her neurologist would like her to avoid famotidine due to anticholinergic properties.  He was comfortable with [place] using PPI I advised that she discontinue famotidine and try over-the-counter omeprazole 20 mg daily.  If this is not controlling her heartburn and GERD symptoms she should let me know and we could consider higher dose omeprazole or another PPI

## 2021-06-17 ENCOUNTER — Other Ambulatory Visit (HOSPITAL_COMMUNITY): Payer: Self-pay

## 2023-02-13 ENCOUNTER — Encounter: Payer: Self-pay | Admitting: Cardiovascular Disease

## 2023-02-13 NOTE — Telephone Encounter (Signed)
Error

## 2023-03-27 NOTE — Progress Notes (Signed)
Cardiology Office Note:   Date:  04/06/2023  NAME:  Lisa Humphrey    MRN: [id number] DOB:  09-23-1966   PCP:  Eldridge Abrahams, MD  Cardiologist:  Reatha Harps, MD  Electrophysiologist:  None   Referring MD: Blima Dessert, FNP   Chief Complaint  Patient presents with   Shortness of Breath         History of Present Illness:   Lisa Humphrey is a 57 y.o. female with a hx of anxiety, HLD who is being seen today for the evaluation of chest pain/SOB at the request of Earp, Marvia Pickles, FNP.  She reports for the past 6 months she has had exertional shortness of breath and occasional chest tightness.  She reports heavy activity or strenuous work gets her out of breath.  She reports climbing an incline or hills gets her profoundly short of breath.  She also reports chest tightness.  Occurs with activity.  Improves with rest.  Does not occur with all activity but does occur periodically.  She also reports her legs swell.  Better in the morning and worse at the end of the day.  She reports a very strong family history of heart disease.  Most of her family members have had MIs or strokes.  Brother with likely familial hyperlipidemia and MI in his 12s.  She tells me that she was screened and did not have familial hyperlipidemia.  She does have migraines and takes verapamil.  No high blood pressure.  She is obese with a BMI of 38.  She tells me she takes cholesterol medication but I do not have copies of those results.  She works as a Engineer, civil (consulting).  She is married.  She has 1 son.  He tells me her son has been diagnosed with stroke.  He did have a PFO closure but continues to have strokes.  Unclear etiology.  She does not smoke.  Alcohol moderation.  No drug use.  CV exam unremarkable.  EKG normal.  Problem List HLD Migraines Obesity -BMI 38  Past Medical History: Past Medical History:  Diagnosis Date   GERD (gastroesophageal reflux disease)    Hyperlipidemia    Migraine    Sleep apnea    CPAP   Urticarial  rash 06/02/2014    Past Surgical History: Past Surgical History:  Procedure Laterality Date   WISDOM TOOTH EXTRACTION      Current Medications: Current Meds  Medication Sig   acetaminophen (TYLENOL) 500 MG tablet Take 500 mg by mouth as needed.   aspirin EC 81 MG tablet Take 81 mg by mouth every morning.   atorvastatin (LIPITOR) 20 MG tablet Take 20 mg by mouth daily.   cetirizine (ZYRTEC) 10 MG tablet Take 10 mg by mouth daily.    Cholecalciferol (VITAMIN D) 2000 units CAPS Take 1 capsule by mouth daily.   cyclobenzaprine (FLEXERIL) 5 MG tablet Take 5 mg by mouth 3 (three) times daily as needed for muscle spasms.   EPINEPHrine (EPIPEN) 0.3 mg/0.3 mL DEVI Inject 0.3 mLs (0.3 mg total) into the muscle as needed.   folic acid (FOLVITE) 800 MCG tablet Take 800 mcg by mouth daily.    meloxicam (MOBIC) 15 MG tablet Take 1 tablet by mouth daily as needed.   metoprolol tartrate (LOPRESSOR) 100 MG tablet Take 1 tablet (100 mg total) by mouth once for 1 dose. Take 2 hours prior to CCTA   Omega-3 Fatty Acids (FISH OIL DOUBLE STRENGTH) 1200 MG CAPS Take 1  capsule by mouth 2 (two) times daily. 500 mg   Omeprazole 20 MG TBDD Take 20 mg by mouth daily.   venlafaxine XR (EFFEXOR-XR) 37.5 MG 24 hr capsule Take 1 capsule (37.5 mg total) by mouth daily with breakfast.   verapamil (CALAN-SR) 180 MG CR tablet Take 180 mg by mouth at bedtime. For miragrines   vitamin B-12 (CYANOCOBALAMIN) 1000 MCG tablet Take 1,000 mcg by mouth daily.   [DISCONTINUED] metoprolol tartrate (LOPRESSOR) 50 MG tablet Take 1 tablet (50 mg total) by mouth once for 1 dose. TAKE TWO HOURS PRIOR TO  SCHEDULE CARDIAC TEST     Allergies:    Codeine, Egg-derived products, Hydrocodone, and Penicillins   Social History: Social History   Socioeconomic History   Marital status: Married    Spouse name: Not on file   Number of children: 1   Years of education: Not on file   Highest education level: Not on file  Occupational  History   Occupation: Conservator, museum/gallery  Tobacco Use   Smoking status: Never   Smokeless tobacco: Never  Substance and Sexual Activity   Alcohol use: Yes    Comment: occasionally   Drug use: No   Sexual activity: Not on file  Other Topics Concern   Not on file  Social History Narrative   Not on file   Social Determinants of Health   Financial Resource Strain: Not on file  Food Insecurity: Not on file  Transportation Needs: Not on file  Physical Activity: Not on file  Stress: Not on file  Social Connections: Not on file     Family History: The patient's family history includes Colon polyps in her brother, father, and mother; Diverticulosis in her father and mother; Heart attack in her father and mother; Heart disease in her father, mother, and sister; Hiatal hernia in her father; Hypertension in her mother; Stroke in her father and mother. There is no history of Colon cancer, Esophageal cancer, Rectal cancer, or Stomach cancer.  ROS:   All other ROS reviewed and negative. Pertinent positives noted in the HPI.     EKGs/Labs/Other Studies Reviewed:   The following studies were personally reviewed by me today:  EKG:  EKG is ordered today.    EKG Interpretation Date/Time:  Monday April 06 2023 01:02:72 EDT Ventricular Rate:  78 PR Interval:  120 QRS Duration:  76 QT Interval:  430 QTC Calculation: 490 R Axis:   -5  Text Interpretation: Normal sinus rhythm Prolonged QT Confirmed by Lennie Odor [phone]) on 04/06/2023 9:27:25 AM   Recent Labs: 04/06/2023: BNP WILL FOLLOW; BUN 11; Creatinine, Ser 0.75; Potassium 4.7; Sodium 141   Recent Lipid Panel No results found for: "CHOL", "TRIG", "HDL", "CHOLHDL", "VLDL", "LDLCALC", "LDLDIRECT"  Physical Exam:   VS:  BP 126/80   Pulse 81   Ht 5' 4.5" (1.638 m)   Wt 229 lb 3.2 oz (104 kg)   SpO2 97%   BMI 38.73 kg/m    Wt Readings from Last 3 Encounters:  04/06/23 229 lb 3.2 oz (104 kg)  09/14/19 201 lb (91.2 kg)   08/25/17 207 lb (93.9 kg)    General: Well nourished, well developed, in no acute distress Head: Atraumatic, normal size  Eyes: PEERLA, EOMI  Neck: Supple, no JVD Endocrine: No thryomegaly Cardiac: Normal S1, S2; RRR; no murmurs, rubs, or gallops Lungs: Clear to auscultation bilaterally, no wheezing, rhonchi or rales  Abd: Soft, nontender, no hepatomegaly  Ext: No edema, pulses 2+ Musculoskeletal:  No deformities, BUE and BLE strength normal and equal Skin: Warm and dry, no rashes   Neuro: Alert and oriented to person, place, time, and situation, CNII-XII grossly intact, no focal deficits  Psych: Normal mood and affect   ASSESSMENT:   Tunja Ivins is a 57 y.o. female who presents for the following: 1. Precordial pain   2. SOB (shortness of breath) on exertion   3. Obesity (BMI 30-39.9)     PLAN:   1. Precordial pain 2. SOB (shortness of breath) on exertion 3. Obesity (BMI 30-39.9) -6 months of exertional shortness of breath and intermittent chest tightness with activity.  EKG normal.  CV exam unremarkable.  Symptoms could represent angina.  I would like to proceed with coronary CTA.  100 mg metoprolol to tartrate to [place] for the scan.  She will need a BMP today.  For her shortness of breath I would like for her to get an echo as well as a BNP today.  No signs of heart failure today.  If above workup is negative her symptoms could just represent deconditioning.  I encouraged her to remain active.  Weight loss was also encouraged today.  For her lower extremity edema we discussed Lasix.  She has none on exam today.  She would like to hold on this for now.  We discussed weight loss and salt reduction.  She will work on this.  She will see me back as needed based on the results of above testing.   Disposition: Return if symptoms worsen or fail to improve.  Medication Adjustments/Labs and Tests Ordered: Current medicines are reviewed at length with the patient today.  Concerns regarding  medicines are outlined above.  Orders Placed This Encounter  Procedures   CT CORONARY MORPH W/CTA COR W/SCORE W/CA W/CM &/OR WO/CM   Brain natriuretic peptide   Basic metabolic panel   EKG 12-Lead   ECHOCARDIOGRAM COMPLETE   Meds ordered this encounter  Medications   DISCONTD: metoprolol tartrate (LOPRESSOR) 50 MG tablet    Sig: Take 1 tablet (50 mg total) by mouth once for 1 dose. TAKE TWO HOURS PRIOR TO  SCHEDULE CARDIAC TEST    Dispense:  1 tablet    Refill:  0   metoprolol tartrate (LOPRESSOR) 100 MG tablet    Sig: Take 1 tablet (100 mg total) by mouth once for 1 dose. Take 2 hours prior to CCTA    Dispense:  180 tablet    Refill:  3    Disregard previous dose   Patient Instructions  Medication Instructions:   One time use of Metoprolol tartrate 100 mg  for CCTA  *If you need a refill on your cardiac medications before your next appointment, please call your pharmacy*   Lab Work: BMP BNP If you have labs (blood work) drawn today and your tests are completely normal, you will receive your results only by: MyChart Message (if you have MyChart) OR A paper copy in the mail If you have any lab test that is abnormal or we need to change your treatment, we will call you to review the results.  Other Instructions     For lower leg swelling - elevate legs as much as possible   And wear compression socks/stockings  Testing/Procedures: Will be schedule at [address] Your physician has requested that you have an echocardiogram. Echocardiography is a painless test that uses sound waves to create images of your heart. It provides your doctor with information  about the size and shape of your heart and how well your heart's chambers and valves are working. This procedure takes approximately one hour. There are no restrictions for this procedure. Please do NOT wear cologne, perfume, aftershave, or lotions (deodorant is allowed). Please arrive 15 minutes prior  to your appointment time. And Will be schedule at [facility] -Radiology dept  Your physician has requested that you have coronary  CTA. Coronary computed tomography (CT)angiogram  is a special type of CT scan that uses a computer to produce multi-dimensional views of major blood vessels throughout the heart.  CT angiography, a contrast material is injected through an IV to help visualize the blood vessels  a painless test that uses an x-ray machine to take clear, detailed pictures of your heart arteries .  Please follow instruction sheet as given.   Follow-Up: At [facility], you and your health needs are our priority.  As part of our continuing mission to provide you with exceptional heart care, we have created designated Provider Care Teams.  These Care Teams include your primary Cardiologist (physician) and Advanced Practice Providers (APPs -  Physician Assistants and Nurse Practitioners) who all work together to provide you with the care you need, when you need it.     Your next appointment:   As needed depending on test results   The format for your next appointment:   In Person  Provider:   Reatha Harps, MD    Other Instructions     For lower leg swelling - elevate legs as much as possible   And wear compression socks/stockings   Your cardiac CT will be scheduled at the below location:   [facility] [address] [place], [place] [address] [phone]    Please arrive at the [facility] and Children's Entrance (Entrance C2) of [facility] 30 minutes prior to test start time. You can use the FREE valet parking offered at entrance C (encouraged to control the heart rate for the test)  Proceed to the [facility] Radiology Department (first floor) to check-in and test prep.  All radiology patients and guests should use entrance C2 at [facility], accessed from [facility], even though the hospital's physical address listed is [address]       Please follow these instructions carefully (unless otherwise directed):  BMP , BNP today   On the Night Before the Test: Be sure to Drink plenty of water. Do not consume any caffeinated/decaffeinated beverages or chocolate 12 hours prior to your test. Do not take any antihistamines 12 hours prior to your test.   On the Day of the Test: Drink plenty of water until 1 hour prior to the test. Do not eat any food 1 hour prior to test. You may take your regular medications prior to the test.  Take metoprolol (Lopressor)  100 mg two hours prior to test. FEMALES- please wear underwire-free bra if available, avoid dresses & tight clothing          After the Test: Drink plenty of water. After receiving IV contrast, you may experience a mild flushed feeling. This is normal. On occasion, you may experience a mild rash up to 24 hours after the test. This is not dangerous. If this occurs, you can take Benadryl 25 mg and increase your fluid intake. If you experience trouble breathing, this can be serious. If it is severe call 911 IMMEDIATELY. If it is mild, please call  our office.   We will call to schedule your test 2-4 weeks out understanding that some insurance companies will need an authorization prior to the service being performed.   For more information and frequently asked questions, please visit our website : [email or web address]  For non-scheduling related questions, please contact the cardiac imaging nurse navigator should you have any questions/concerns: Rockwell Alexandria, Cardiac Imaging Nurse Navigator Larey Brick, Cardiac Imaging Nurse Navigator [facility] Direct Office Dial: [phone]   For scheduling needs, including cancellations and rescheduling, please call [place], [phone].    Signed, Lenna Gilford. Flora Lipps, MD, [facility]  [facility]  [address], [address] [place], [place]  [address] [phone]  04/06/2023 8:47 PM

## 2023-04-06 ENCOUNTER — Encounter: Payer: Self-pay | Admitting: Cardiovascular Disease

## 2023-04-06 ENCOUNTER — Ambulatory Visit: Payer: BC Managed Care – PPO | Attending: Cardiovascular Disease | Admitting: Cardiovascular Disease

## 2023-04-06 ENCOUNTER — Ambulatory Visit: Payer: BC Managed Care – PPO | Admitting: Cardiovascular Disease

## 2023-04-06 VITALS — BP 126/80 | HR 81 | Ht 64.5 in | Wt 229.2 lb

## 2023-04-06 DIAGNOSIS — R072 Precordial pain: Secondary | ICD-10-CM

## 2023-04-06 DIAGNOSIS — E669 Obesity, unspecified: Secondary | ICD-10-CM

## 2023-04-06 DIAGNOSIS — R0602 Shortness of breath: Secondary | ICD-10-CM | POA: Diagnosis not present

## 2023-04-06 MED ORDER — METOPROLOL TARTRATE 50 MG PO TABS: 50.0000 mg | ORAL_TABLET | Freq: Once | ORAL | 0 refills | Status: DC

## 2023-04-06 MED ORDER — METOPROLOL TARTRATE 100 MG PO TABS: 100.0000 mg | ORAL_TABLET | Freq: Once | ORAL | 3 refills | Status: AC

## 2023-04-06 NOTE — Patient Instructions (Addendum)
Medication Instructions:   One time use of Metoprolol tartrate 100 mg  for CCTA  *If you need a refill on your cardiac medications before your next appointment, please call your pharmacy*   Lab Work: BMP BNP If you have labs (blood work) drawn today and your tests are completely normal, you will receive your results only by: MyChart Message (if you have MyChart) OR A paper copy in the mail If you have any lab test that is abnormal or we need to change your treatment, we will call you to review the results.  Other Instructions     For lower leg swelling - elevate legs as much as possible   And wear compression socks/stockings  Testing/Procedures: Will be schedule at [address] Your physician has requested that you have an echocardiogram. Echocardiography is a painless test that uses sound waves to create images of your heart. It provides your doctor with information about the size and shape of your heart and how well your heart's chambers and valves are working. This procedure takes approximately one hour. There are no restrictions for this procedure. Please do NOT wear cologne, perfume, aftershave, or lotions (deodorant is allowed). Please arrive 15 minutes prior to your appointment time. And Will be schedule at [facility] -Radiology dept  Your physician has requested that you have coronary  CTA. Coronary computed tomography (CT)angiogram  is a special type of CT scan that uses a computer to produce multi-dimensional views of major blood vessels throughout the heart.  CT angiography, a contrast material is injected through an IV to help visualize the blood vessels  a painless test that uses an x-ray machine to take clear, detailed pictures of your heart arteries .  Please follow instruction sheet as given.   Follow-Up: At [facility], you and your health needs are our priority.  As part of our continuing mission to provide you with exceptional heart care,  we have created designated Provider Care Teams.  These Care Teams include your primary Cardiologist (physician) and Advanced Practice Providers (APPs -  Physician Assistants and Nurse Practitioners) who all work together to provide you with the care you need, when you need it.     Your next appointment:   As needed depending on test results   The format for your next appointment:   In Person  Provider:   Reatha Harps, MD    Other Instructions     For lower leg swelling - elevate legs as much as possible   And wear compression socks/stockings   Your cardiac CT will be scheduled at the below location:   [facility] [address] [place], [place] [address] [phone]    Please arrive at the [facility] and Children's Entrance (Entrance C2) of [facility] 30 minutes prior to test start time. You can use the FREE valet parking offered at entrance C (encouraged to control the heart rate for the test)  Proceed to the [facility] Radiology Department (first floor) to check-in and test prep.  All radiology patients and guests should use entrance C2 at [facility], accessed from [facility], even though the hospital's physical address listed is [address]       Please follow these instructions carefully (unless otherwise directed):  BMP , BNP today   On the Night Before the Test: Be sure to Drink plenty of water. Do not consume any caffeinated/decaffeinated beverages or chocolate 12 hours prior  to your test. Do not take any antihistamines 12 hours prior to your test.   On the Day of the Test: Drink plenty of water until 1 hour prior to the test. Do not eat any food 1 hour prior to test. You may take your regular medications prior to the test.  Take metoprolol (Lopressor)  100 mg two hours prior to test. FEMALES- please wear underwire-free bra if available, avoid dresses & tight clothing          After the  Test: Drink plenty of water. After receiving IV contrast, you may experience a mild flushed feeling. This is normal. On occasion, you may experience a mild rash up to 24 hours after the test. This is not dangerous. If this occurs, you can take Benadryl 25 mg and increase your fluid intake. If you experience trouble breathing, this can be serious. If it is severe call 911 IMMEDIATELY. If it is mild, please call our office.   We will call to schedule your test 2-4 weeks out understanding that some insurance companies will need an authorization prior to the service being performed.   For more information and frequently asked questions, please visit our website : [email or web address]  For non-scheduling related questions, please contact the cardiac imaging nurse navigator should you have any questions/concerns: Rockwell Alexandria, Cardiac Imaging Nurse Navigator Larey Brick, Cardiac Imaging Nurse Navigator [place] [facility] Direct Office Dial: [phone]   For scheduling needs, including cancellations and rescheduling, please call [place], [phone].

## 2023-04-07 LAB — BASIC METABOLIC PANEL
BUN/Creatinine Ratio: 15 (ref 9–23)
BUN: 11 mg/dL (ref 6–24)
CO2: 26 mmol/L (ref 20–29)
Calcium: 9.9 mg/dL (ref 8.7–10.2)
Chloride: 102 mmol/L (ref 96–106)
Creatinine, Ser: 0.75 mg/dL (ref 0.57–1.00)
Glucose: 101 mg/dL — ABNORMAL HIGH (ref 70–99)
Potassium: 4.7 mmol/L (ref 3.5–5.2)
Sodium: 141 mmol/L (ref 134–144)
eGFR: 92 mL/min/{1.73_m2} (ref >59)

## 2023-04-07 LAB — BRAIN NATRIURETIC PEPTIDE: BNP: 44.5 pg/mL (ref 0.0–100.0)

## 2023-04-08 ENCOUNTER — Ambulatory Visit (HOSPITAL_COMMUNITY): Payer: BC Managed Care – PPO | Attending: Internal Medicine

## 2023-04-08 DIAGNOSIS — R0602 Shortness of breath: Secondary | ICD-10-CM | POA: Diagnosis present

## 2023-04-08 DIAGNOSIS — R072 Precordial pain: Secondary | ICD-10-CM | POA: Diagnosis present

## 2023-04-08 LAB — ECHOCARDIOGRAM COMPLETE
Area-P 1/2: 3.83 cm2
S' Lateral: 2.5 cm

## 2023-04-13 ENCOUNTER — Encounter (HOSPITAL_COMMUNITY): Payer: Self-pay

## 2023-04-13 ENCOUNTER — Telehealth (HOSPITAL_COMMUNITY): Payer: Self-pay | Admitting: *Deleted

## 2023-04-13 NOTE — Telephone Encounter (Signed)
Attempted to call patient regarding upcoming cardiac CT appointment. Left message on voicemail with name and callback number Hayley Sharpe RN Navigator Cardiac Imaging [facility] [phone] Office   

## 2023-04-14 ENCOUNTER — Ambulatory Visit (HOSPITAL_COMMUNITY)
Admission: RE | Admit: 2023-04-14 | Discharge: 2023-04-14 | Disposition: A | Discharge status: Home / Self Care | Payer: BC Managed Care – PPO | Source: Ambulatory Visit | Attending: Cardiovascular Disease | Admitting: Cardiovascular Disease

## 2023-04-14 ENCOUNTER — Encounter (HOSPITAL_COMMUNITY): Payer: Self-pay

## 2023-04-14 DIAGNOSIS — R0602 Shortness of breath: Secondary | ICD-10-CM

## 2023-04-14 DIAGNOSIS — R072 Precordial pain: Secondary | ICD-10-CM | POA: Diagnosis present

## 2023-04-14 MED ORDER — IOHEXOL 350 MG/ML SOLN
95.0000 mL | Freq: Once | INTRAVENOUS | Status: AC | PRN
  Administered 2023-04-14: 95 mL via INTRAVENOUS

## 2023-04-14 MED ORDER — NITROGLYCERIN 0.4 MG SL SUBL
0.8000 mg | SUBLINGUAL_TABLET | Freq: Once | SUBLINGUAL | Status: AC
  Administered 2023-04-14: 0.8 mg via SUBLINGUAL

## 2023-04-14 MED ORDER — NITROGLYCERIN 0.4 MG SL SUBL
SUBLINGUAL_TABLET | SUBLINGUAL | Status: AC
  Filled 2023-04-14: qty 2

## 2023-05-08 ENCOUNTER — Ambulatory Visit: Payer: BC Managed Care – PPO | Admitting: Gastroenterology

## 2023-05-08 ENCOUNTER — Other Ambulatory Visit: Payer: BC Managed Care – PPO

## 2023-05-08 ENCOUNTER — Encounter: Payer: Self-pay | Admitting: Gastroenterology

## 2023-05-08 VITALS — BP 140/100 | HR 102 | Ht 64.0 in | Wt 223.0 lb

## 2023-05-08 DIAGNOSIS — K625 Hemorrhage of anus and rectum: Secondary | ICD-10-CM

## 2023-05-08 DIAGNOSIS — R7989 Other specified abnormal findings of blood chemistry: Secondary | ICD-10-CM | POA: Diagnosis not present

## 2023-05-08 DIAGNOSIS — K5900 Constipation, unspecified: Secondary | ICD-10-CM

## 2023-05-08 DIAGNOSIS — K760 Fatty (change of) liver, not elsewhere classified: Secondary | ICD-10-CM | POA: Diagnosis not present

## 2023-05-08 DIAGNOSIS — R0609 Other forms of dyspnea: Secondary | ICD-10-CM

## 2023-05-08 LAB — COMPREHENSIVE METABOLIC PANEL
ALT: 68 U/L — ABNORMAL HIGH (ref 0–35)
AST: 40 U/L — ABNORMAL HIGH (ref 0–37)
Albumin: 4.9 g/dL (ref 3.5–5.2)
Alkaline Phosphatase: 99 U/L (ref 39–117)
BUN: 16 mg/dL (ref 6–23)
CO2: 27 mEq/L (ref 19–32)
Calcium: 10.6 mg/dL — ABNORMAL HIGH (ref 8.4–10.5)
Chloride: 101 mEq/L (ref 96–112)
Creatinine, Ser: 0.77 mg/dL (ref 0.40–1.20)
GFR: 85.17 mL/min (ref >60.00)
Glucose, Bld: 79 mg/dL (ref 70–99)
Potassium: 4.3 mEq/L (ref 3.5–5.1)
Sodium: 136 mEq/L (ref 135–145)
Total Bilirubin: 0.4 mg/dL (ref 0.2–1.2)
Total Protein: 8.4 g/dL — ABNORMAL HIGH (ref 6.0–8.3)

## 2023-05-08 LAB — CBC WITH DIFFERENTIAL/PLATELET
Basophils Absolute: 0.1 10*3/uL (ref 0.0–0.1)
Basophils Relative: 1.5 % (ref 0.0–3.0)
Eosinophils Absolute: 0.1 10*3/uL (ref 0.0–0.7)
Eosinophils Relative: 1.4 % (ref 0.0–5.0)
HCT: 45.5 % (ref 36.0–46.0)
Hemoglobin: 14.7 g/dL (ref 12.0–15.0)
Lymphocytes Relative: 37.8 % (ref 12.0–46.0)
Lymphs Abs: 3.5 10*3/uL (ref 0.7–4.0)
MCHC: 32.3 g/dL (ref 30.0–36.0)
MCV: 89.1 fl (ref 78.0–100.0)
Monocytes Absolute: 0.7 10*3/uL (ref 0.1–1.0)
Monocytes Relative: 7.9 % (ref 3.0–12.0)
Neutro Abs: 4.8 10*3/uL (ref 1.4–7.7)
Neutrophils Relative %: 51.4 % (ref 43.0–77.0)
Platelets: 287 10*3/uL (ref 150.0–400.0)
RBC: 5.11 Mil/uL (ref 3.87–5.11)
RDW: 14.1 % (ref 11.5–15.5)
WBC: 9.4 10*3/uL (ref 4.0–10.5)

## 2023-05-08 LAB — IBC + FERRITIN
Ferritin: 36 ng/mL (ref 10.0–291.0)
Iron: 66 ug/dL (ref 42–145)
Saturation Ratios: 14.7 % — ABNORMAL LOW (ref 20.0–50.0)
TIBC: 449.4 ug/dL (ref 250.0–450.0)
Transferrin: 321 mg/dL (ref 212.0–360.0)

## 2023-05-08 LAB — PROTIME-INR
INR: 1.1 ratio — ABNORMAL HIGH (ref 0.8–1.0)
Prothrombin Time: 12.1 s (ref 9.6–13.1)

## 2023-05-08 NOTE — Progress Notes (Signed)
Chief Complaint: Hepatic steatosis Primary GI MD: Dr. Rhea Belton  HPI: 57 year old female with medical history as listed below presents for evaluation of fatty liver  Echocardiogram 03/2023 with ejection fraction 60 to 65%  US abdomen complete 04/21/2023 done for elevated liver enzymes shows enlarged liver measuring up to 18.9 cm with regions of fat sparing noted.  Gallbladder is normal.  Patient has had mild elevation in AST/ALT since at least October 2022 (see below).  06/2021  AST 36/ALT 57/alk phos 84 bilirubin 0.4  09/2021 AST 30/ALT 44/alk phos 115.   Bilirubin 0.4  07/2022  AST 32/ALT 42/alk phos 113.  01/2023 AST 45/ALT 56/alk phos 103.   T. bili 0.4  04/08/2023  AST 54/ALT 89/alk phos 124.   Total bili 0.5  Patient states she has an sense of family history of fatty liver.  She states the majority of people on her father side are obese and had fatty liver.  Her mother also had fatty liver.  She states recently she has had some unintentional weight gain secondary to menopause and she has had trouble losing weight.  Denies pain.  Reports chronic constipation.  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  Bright red blood on the tissue paper.  She does well with activity a yogurt and she feels this is adequately controlling her bowel movements to where she does not have to push.  Denies NSAID use.  Social alcohol use.  She has been using some herbal remedies lately but has stopped within the last month.  Patient has also had new onset dyspnea on exertion and has underwent full cardiac workup which was negative.  Also reports some wheezing with her new dyspnea.   PREVIOUS GI WORKUP   EGD 03/2015 for GERD - Mucosa of esophagus appeared normal - 2 cm hiatal hernia - Stomach and duodenum normal  Colonoscopy 04/23/2015 for screening - Normal colonoscopy - Repeat 10 years  Past Medical History:  Diagnosis Date   GERD (gastroesophageal reflux  disease)    Hyperlipidemia    Migraine    Sleep apnea    CPAP   Urticarial rash 06/02/2014    Past Surgical History:  Procedure Laterality Date   WISDOM TOOTH EXTRACTION      Current Outpatient Medications  Medication Sig Dispense Refill   acetaminophen (TYLENOL) 500 MG tablet Take 500 mg by mouth as needed.     aspirin EC 81 MG tablet Take 81 mg by mouth every morning.     atorvastatin (LIPITOR) 20 MG tablet Take 20 mg by mouth daily.     cetirizine (ZYRTEC) 10 MG tablet Take 10 mg by mouth daily.      Cholecalciferol (VITAMIN D) 2000 units CAPS Take 1 capsule by mouth daily.     cyclobenzaprine (FLEXERIL) 5 MG tablet Take 5 mg by mouth 3 (three) times daily as needed for muscle spasms.     EPINEPHrine (EPIPEN) 0.3 mg/0.3 mL DEVI Inject 0.3 mLs (0.3 mg total) into the muscle as needed. 2 Device 1   folic acid (FOLVITE) 800 MCG tablet Take 800 mcg by mouth daily.      gabapentin (NEURONTIN) 100 MG capsule Take 2 capsules (200 mg total) by mouth at bedtime. (Patient not taking: Reported on 04/06/2023) 60 capsule 3   meloxicam (MOBIC) 15 MG tablet TAKE 1 TABLET BY MOUTH EVERY DAY (Patient not taking: Reported on 04/06/2023) 30 tablet 1   meloxicam (MOBIC) 15 MG tablet TAKE 1 TABLET BY  MOUTH EVERY DAY (Patient not taking: Reported on 04/06/2023) 30 tablet 1   meloxicam (MOBIC) 15 MG tablet Take 1 tablet by mouth daily as needed.     metoprolol tartrate (LOPRESSOR) 100 MG tablet Take 1 tablet (100 mg total) by mouth once for 1 dose. Take 2 hours prior to CCTA 180 tablet 3   Omega-3 Fatty Acids (FISH OIL DOUBLE STRENGTH) 1200 MG CAPS Take 1 capsule by mouth 2 (two) times daily. 500 mg     Omeprazole 20 MG TBDD Take 20 mg by mouth daily.     predniSONE (DELTASONE) 50 MG tablet 1 tablet by mouth daily (Patient not taking: Reported on 04/06/2023) 5 tablet 0   pyridOXINE (B-6) 50 MG tablet Take 100 mg by mouth daily.  (Patient not taking: Reported on 04/06/2023)     SUMAtriptan (IMITREX) 100 MG tablet  Take 100 mg by mouth every 2 (two) hours as needed for migraine. (Patient not taking: Reported on 04/06/2023)     venlafaxine XR (EFFEXOR-XR) 37.5 MG 24 hr capsule Take 1 capsule (37.5 mg total) by mouth daily with breakfast. 90 capsule 2   verapamil (CALAN-SR) 180 MG CR tablet Take 180 mg by mouth at bedtime. For miragrines     vitamin B-12 (CYANOCOBALAMIN) 1000 MCG tablet Take 1,000 mcg by mouth daily.     No current facility-administered medications for this visit.    Allergies as of 05/08/2023 - Review Complete 04/14/2023  Allergen Reaction Noted   Codeine Nausea And Vomiting 11/01/2012   Egg-derived products Other (See Comments) 11/01/2012   Hydrocodone Nausea And Vomiting 11/01/2012   Penicillins Rash 11/01/2012    Family History  Problem Relation Age of Onset   Hypertension Mother    Heart disease Mother        BYPASS AND  A ABLATION   Heart attack Mother    Stroke Mother    Colon polyps Mother    Diverticulosis Mother    Heart attack Father    Heart disease Father        OPEN HEART SURGERY/HEART FAILURE/ PACEMAKER AND DEFIB    Stroke Father    Colon polyps Father    Diverticulosis Father    Hiatal hernia Father    Colon polyps Brother    Heart disease Sister        HEART ATTACK AGE 26/ HE HAS A STENTX1/ CABG X4 VESSELS AT AGE 10 / AT AGE 42 BRAIN STEM STROKE   Colon cancer Neg Hx    Esophageal cancer Neg Hx    Rectal cancer Neg Hx    Stomach cancer Neg Hx     Social History   Socioeconomic History   Marital status: Married    Spouse name: Not on file   Number of children: 1   Years of education: Not on file   Highest education level: Not on file  Occupational History   Occupation: Conservator, museum/gallery  Tobacco Use   Smoking status: Never   Smokeless tobacco: Never  Substance and Sexual Activity   Alcohol use: Yes    Comment: occasionally   Drug use: No   Sexual activity: Not on file  Other Topics Concern   Not on file  Social History  Narrative   Not on file   Social Determinants of Health   Financial Resource Strain: Not on file  Food Insecurity: Not on file  Transportation Needs: Not on file  Physical Activity: Not on file  Stress: Not on file  Social Connections: Not on file  Intimate Partner Violence: Not on file    Review of Systems:    Constitutional: No weight loss, fever, chills, weakness or fatigue HEENT: Eyes: No change in vision               Ears, Nose, Throat:  No change in hearing or congestion Skin: No rash or itching Cardiovascular: No chest pain, chest pressure or palpitations   Respiratory: No SOB or cough Gastrointestinal: See HPI and otherwise negative Genitourinary: No dysuria or change in urinary frequency Neurological: No headache, dizziness or syncope Musculoskeletal: No new muscle or joint pain Hematologic: No bleeding or bruising Psychiatric: No history of depression or anxiety    Physical Exam:  Vital signs: There were no vitals taken for this visit.  Constitutional: NAD, Well developed, Well nourished, alert and cooperative Head:  Normocephalic and atraumatic. Eyes:   PEERL, EOMI. No icterus. Conjunctiva pink. Respiratory: Respirations even and unlabored. Lungs clear to auscultation bilaterally.   No wheezes, crackles, or rhonchi.  Cardiovascular:  Regular rate and rhythm. No peripheral edema, cyanosis or pallor.  Gastrointestinal:  Soft, nondistended, nontender. No rebound or guarding. Normal bowel sounds. No appreciable masses or hepatomegaly. Rectal:  Not performed.  Msk:  Symmetrical without gross deformities. Without edema, no deformity or joint abnormality.  Neurologic:  Alert and  oriented x4;  grossly normal neurologically.  Skin:   Dry and intact without significant lesions or rashes. Psychiatric: Oriented to person, place and time. Demonstrates good judgement and reason without abnormal affect or behaviors.   RELEVANT LABS AND IMAGING: CBC    Component Value  Date/Time   WBC 7.7 11/01/2012 1307   RBC 4.52 11/01/2012 1307   HGB 13.5 11/01/2012 1307   HCT 40.5 11/01/2012 1307   PLT 272 11/01/2012 1307   MCV 89.6 11/01/2012 1307   MCH 29.9 11/01/2012 1307   MCHC 33.3 11/01/2012 1307   RDW 12.9 11/01/2012 1307   LYMPHSABS 2.6 11/01/2012 1307   MONOABS 0.4 11/01/2012 1307   EOSABS 0.2 11/01/2012 1307   BASOSABS 0.0 11/01/2012 1307    CMP     Component Value Date/Time   NA 141 04/06/2023 1200   K 4.7 04/06/2023 1200   CL 102 04/06/2023 1200   CO2 26 04/06/2023 1200   GLUCOSE 101 (H) 04/06/2023 1200   BUN 11 04/06/2023 1200   CREATININE 0.75 04/06/2023 1200   CALCIUM 9.9 04/06/2023 1200     Assessment/Plan:   Hepatic steatosis Hepatic steatosis with elevated liver enzymes and ultrasound showing hepatomegaly with fat deposits.  Liver enzymes steadily increasing since 2022.  Over the last 6 months she has been taking herbal supplements.  Question if new herbal supplements may have increased her LFTs compared to previous serologic evaluation.  She has recently stopped herbal supplements.  --- Serologic evaluation for elevated LFTs including alpha trypsin 1, ceruloplasmin, ANA, ASMA, hepatitis, iron, PT/INR, ferritin. --- Educated patient on hepatic steatosis and importance of lifestyle modification such as diet and exercise and weight loss.  She is amenable and is planning to start weight watchers. --- Provided patient education handout --- Based on serological workup if negative will calculate FIB4 score and consider ELF to assess fibrosis as well as fibrosure --- need LFTs and CBC monitored every 6 months, revaluation every 2-3 years.  --Continue to work on risk factor modification including diet exercise and control of risk factors including blood sugars.   Constipation Currently well-controlled on activity a yogurt and increased fiber  Rectal bleeding  Minimal bright red blood on tissue paper with wiping which she attributes to an  anal fissure.  Rectal exam not performed today.  Discussed option of getting colonoscopy done earlier (originally due in 2026) due to the presence of rectal bleeding.  Patient states she is not concerned as she knows she has a fissure and would like to proceed with colonoscopy in 2026  Dyspnea on exertion Extensive negative cardiac workup including echocardiogram.  Continuing to have dyspnea on exertion greater than baseline. --- Consider referral to pulmonology especially with her complaint of wheezing.  Defer to PCP.  L[facility] 05/08/2023, 8:35 AM  Cc: Pomposini, Rande Brunt, MD

## 2023-05-08 NOTE — Patient Instructions (Signed)
Your provider has requested that you go to the basement level for lab work before leaving today. Press "B" on the elevator. The lab is located at the first door on the left as you exit the elevator.  Follow up as needed.   _______________________________________________________  If your blood pressure at your visit was 140/90 or greater, please contact your primary care physician to follow up on this.  _______________________________________________________  If you are age 47 or older, your body mass index should be between 23-30. Your Body mass index is 38.28 kg/m. If this is out of the aforementioned range listed, please consider follow up with your Primary Care Provider.  If you are age 76 or younger, your body mass index should be between 19-25. Your Body mass index is 38.28 kg/m. If this is out of the aformentioned range listed, please consider follow up with your Primary Care Provider.   ________________________________________________________  The [place] [facility] providers would like to encourage you to use [facility] to communicate with providers for non-urgent requests or questions.  Due to long hold times on the telephone, sending your provider a message by [facility] may be a faster and more efficient way to get a response.  Please allow 48 business hours for a response.  Please remember that this is for non-urgent requests.  _______________________________________________________

## 2023-05-15 NOTE — Progress Notes (Signed)
Addendum: Reviewed and agree with assessment and management plan. Pyrtle, Jay M, MD  

## 2023-07-08 ENCOUNTER — Telehealth: Payer: Self-pay | Admitting: Cardiovascular Disease

## 2023-07-08 DIAGNOSIS — E785 Hyperlipidemia, unspecified: Secondary | ICD-10-CM

## 2023-07-08 NOTE — Telephone Encounter (Signed)
Patient stated she has not had recent lipid test as her physical is now scheduled on 10/30.  Patient wants to know if she should fast prior to her visit tomorrow to have blood drawn at the visit.  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

## 2023-07-08 NOTE — Progress Notes (Unsigned)
Cardiology Clinic Note   Patient Name: Lisa Humphrey Date of Encounter: 07/08/2023  Primary Care Provider:  Eldridge Abrahams, MD Primary Cardiologist:  Reatha Harps, MD  Patient Profile     57 y.o. female with a hx of anxiety, HLD who is being seen today for the evaluation of chest pain/SOB and obesity.  She works as a Engineer, civil (consulting).  She does have OSA and is on CPAP.  Was last seen by Dr. Bufford Buttner on 04/06/2023 at which time we ordered coronary CTA.  She also has chronic lower extremity edema and diuretic was held off on that office visit.  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  Past Medical History    Past Medical History:  Diagnosis Date   GERD (gastroesophageal reflux disease)    Hyperlipidemia    Migraine    Sleep apnea    CPAP   Urticarial rash 06/02/2014   Past Surgical History:  Procedure Laterality Date   WISDOM TOOTH EXTRACTION      Allergies  Allergies  Allergen Reactions   Codeine Nausea And Vomiting   Egg-Derived Products Other (See Comments)    "Allergy test was positive years ago, I eat eggs" per pt.   Hydrocodone Nausea And Vomiting   Penicillins Rash    History of Present Illness    Lisa Humphrey returns to the office today for ongoing assessment and management of chest discomfort, shortness of breath, or discussion of coronary CTA results.  She also has chronic lower extremity edema.  Home Medications    Current Outpatient Medications  Medication Sig Dispense Refill   acetaminophen (TYLENOL) 500 MG tablet Take 500 mg by mouth as needed.     aspirin EC 81 MG tablet Take 81 mg by mouth every morning.     atorvastatin (LIPITOR) 20 MG tablet Take 20 mg by mouth daily.     cetirizine (ZYRTEC) 10 MG tablet Take 10 mg by mouth daily.  (Patient not taking: Reported on 05/08/2023)     Cholecalciferol (VITAMIN D) 2000 units CAPS Take 1 capsule by mouth daily.     cyclobenzaprine (FLEXERIL) 5 MG tablet Take 5 mg by mouth 3 (three) times daily as needed  for muscle spasms.     EPINEPHrine (EPIPEN) 0.3 mg/0.3 mL DEVI Inject 0.3 mLs (0.3 mg total) into the muscle as needed. (Patient not taking: Reported on 05/08/2023) 2 Device 1   folic acid (FOLVITE) 800 MCG tablet Take 800 mcg by mouth daily.      meloxicam (MOBIC) 15 MG tablet TAKE 1 TABLET BY MOUTH EVERY DAY 30 tablet 1   metoprolol tartrate (LOPRESSOR) 100 MG tablet Take 1 tablet (100 mg total) by mouth once for 1 dose. Take 2 hours prior to CCTA 180 tablet 3   Omega-3 Fatty Acids (FISH OIL DOUBLE STRENGTH) 1200 MG CAPS Take 1 capsule by mouth 2 (two) times daily. 500 mg     Omeprazole 20 MG TBDD Take 20 mg by mouth daily.     predniSONE (DELTASONE) 50 MG tablet 1 tablet by mouth daily (Patient not taking: Reported on 04/06/2023) 5 tablet 0   pyridOXINE (B-6) 50 MG tablet Take 100 mg by mouth daily.  (Patient not taking: Reported on 04/06/2023)     SUMAtriptan (IMITREX) 100 MG tablet Take 100 mg by mouth every 2 (two) hours as needed for migraine. (Patient not taking: Reported on 04/06/2023)     venlafaxine XR (EFFEXOR-XR) 37.5 MG 24 hr capsule Take 1 capsule (37.5 mg total) by  mouth daily with breakfast. 90 capsule 2   verapamil (CALAN-SR) 180 MG CR tablet Take 180 mg by mouth at bedtime. For miragrines     vitamin B-12 (CYANOCOBALAMIN) 1000 MCG tablet Take 1,000 mcg by mouth daily.     No current facility-administered medications for this visit.     Family History    Family History  Problem Relation Age of Onset   Hypertension Mother    Heart disease Mother        BYPASS AND  A ABLATION   Heart attack Mother    Stroke Mother    Colon polyps Mother    Diverticulosis Mother    Heart attack Father    Heart disease Father        OPEN HEART SURGERY/HEART FAILURE/ PACEMAKER AND DEFIB    Stroke Father    Colon polyps Father    Diverticulosis Father    Hiatal hernia Father    Heart disease Sister        HEART ATTACK AGE 16/ HE HAS A STENTX1/ CABG X4 VESSELS AT AGE 69 / AT AGE 33 BRAIN STEM  STROKE   Colon polyps Brother    Colon cancer Neg Hx    Esophageal cancer Neg Hx    She indicated that her mother is alive. She indicated that her father is alive. She indicated that the status of her sister is unknown. She indicated that her brother is alive. She indicated that the status of her neg hx is unknown.  Social History    Social History   Socioeconomic History   Marital status: Married    Spouse name: Not on file   Number of children: 1   Years of education: Not on file   Highest education level: Not on file  Occupational History   Occupation: Conservator, museum/gallery  Tobacco Use   Smoking status: Never   Smokeless tobacco: Never  Vaping Use   Vaping status: Never Used  Substance and Sexual Activity   Alcohol use: Yes    Comment: occasionally   Drug use: No   Sexual activity: Not on file  Other Topics Concern   Not on file  Social History Narrative   Not on file   Social Determinants of Health   Financial Resource Strain: Not on file  Food Insecurity: Not on file  Transportation Needs: Not on file  Physical Activity: Not on file  Stress: Not on file  Social Connections: Not on file  Intimate Partner Violence: Not on file     Review of Systems    General:  No chills, fever, night sweats or weight changes.  Cardiovascular:  No chest pain, dyspnea on exertion, edema, orthopnea, palpitations, paroxysmal nocturnal dyspnea. Dermatological: No rash, lesions/masses Respiratory: No cough, dyspnea Urologic: No hematuria, dysuria Abdominal:   No nausea, vomiting, diarrhea, bright red blood per rectum, melena, or hematemesis Neurologic:  No visual changes, wkns, changes in mental status. All other systems reviewed and are otherwise negative except as noted above.       Physical Exam    VS:  There were no vitals taken for this visit. , BMI There is no height or weight on file to calculate BMI.     GEN: Well nourished, well developed, in no acute  distress. HEENT: normal. Neck: Supple, no JVD, carotid bruits, or masses. Cardiac: RRR, no murmurs, rubs, or gallops. No clubbing, cyanosis, edema.  Radials/DP/PT 2+ and equal bilaterally.  Respiratory:  Respirations regular and unlabored, clear to  auscultation bilaterally. GI: Soft, nontender, nondistended, BS + x 4. MS: no deformity or atrophy. Skin: warm and dry, no rash. Neuro:  Strength and sensation are intact. Psych: Normal affect.      Lab Results  Component Value Date   WBC 9.4 05/08/2023   HGB 14.7 05/08/2023   HCT 45.5 05/08/2023   MCV 89.1 05/08/2023   PLT 287.0 05/08/2023   Lab Results  Component Value Date   CREATININE 0.77 05/08/2023   BUN 16 05/08/2023   NA 136 05/08/2023   K 4.3 05/08/2023   CL 101 05/08/2023   CO2 27 05/08/2023   Lab Results  Component Value Date   ALT 68 (H) 05/08/2023   AST 40 (H) 05/08/2023   ALKPHOS 99 05/08/2023   BILITOT 0.4 05/08/2023   No results found for: "CHOL", "HDL", "LDLCALC", "LDLDIRECT", "TRIG", "CHOLHDL"  No results found for: "HGBA1C"   Review of Prior Studies     Coronary CTA 04/14/2023 IMPRESSION: 1. Coronary calcium score of 193. This was 61 percentile for age-, sex, and race-matched controls.   2. Normal coronary origin with right dominance.   3. Mild (25-49) calcified stenosis in the proximal LAD; otherwise minimal plaque.   4. Total plaque volume 198 mm3 which is 74 percentile for age- and sex-matched controls (calcified plaque 41 mm3; non-calcified plaque 157 mm3). TPV is (moderate).   RECOMMENDATIONS: CAD-RADS 2: Mild non-obstructive CAD (25-49%). Consider non-atherosclerotic causes of chest pain. Consider preventive therapy and risk factor modification. Assessment & Plan   1.  ***     {Are you ordering a CV Procedure (e.g. stress test, cath, DCCV, TEE, etc)?   Press F2        :[id number]}   Signed, Bettey Mare. Liborio Nixon, ANP, AACC   07/08/2023 2:39 PM      Office [phone] Fax  [phone]  Notice: This dictation was prepared with Dragon dictation along with smaller phrase technology. Any transcriptional errors that result from this process are unintentional and may not be corrected upon review.

## 2023-07-09 ENCOUNTER — Ambulatory Visit: Payer: BC Managed Care – PPO | Attending: Adult Health | Admitting: Adult Health

## 2023-07-09 ENCOUNTER — Encounter: Payer: Self-pay | Admitting: Adult Health

## 2023-07-09 VITALS — BP 124/80 | HR 77 | Ht 64.0 in | Wt 223.0 lb

## 2023-07-09 DIAGNOSIS — E66812 Obesity, class 2: Secondary | ICD-10-CM | POA: Diagnosis not present

## 2023-07-09 DIAGNOSIS — E6609 Other obesity due to excess calories: Secondary | ICD-10-CM

## 2023-07-09 DIAGNOSIS — E7800 Pure hypercholesterolemia, unspecified: Secondary | ICD-10-CM

## 2023-07-09 DIAGNOSIS — Z6839 Body mass index (BMI) 39.0-39.9, adult: Secondary | ICD-10-CM

## 2023-07-09 DIAGNOSIS — I2510 Atherosclerotic heart disease of native coronary artery without angina pectoris: Secondary | ICD-10-CM | POA: Diagnosis not present

## 2023-07-09 DIAGNOSIS — R609 Edema, unspecified: Secondary | ICD-10-CM | POA: Diagnosis not present

## 2023-07-09 DIAGNOSIS — R0602 Shortness of breath: Secondary | ICD-10-CM

## 2023-07-09 NOTE — Telephone Encounter (Signed)
Patient states she has her appt with PCP for the 30th and would like to have her Lipid done at their office.  Advised will send lab order to their office and she can have done then.  She is aware it is a fasting lab. Lab ordered . Lab faxed to PCP

## 2023-07-09 NOTE — Patient Instructions (Signed)
Medication Instructions:  No Changes *If you need a refill on your cardiac medications before your next appointment, please call your pharmacy*   Lab Work: No Labs If you have labs (blood work) drawn today and your tests are completely normal, you will receive your results only by: MyChart Message (if you have MyChart) OR A paper copy in the mail If you have any lab test that is abnormal or we need to change your treatment, we will call you to review the results.   Testing/Procedures: No Testing   Follow-Up: At [facility], you and your health needs are our priority.  As part of our continuing mission to provide you with exceptional heart care, we have created designated Provider Care Teams.  These Care Teams include your primary Cardiologist (physician) and Advanced Practice Providers (APPs -  Physician Assistants and Nurse Practitioners) who all work together to provide you with the care you need, when you need it.  We recommend signing up for the patient portal called "MyChart".  Sign up information is provided on this After Visit Summary.  MyChart is used to connect with patients for Virtual Visits (Telemedicine).  Patients are able to view lab/test results, encounter notes, upcoming appointments, etc.  Non-urgent messages can be sent to your provider as well.   To learn more about what you can do with MyChart, go to [email or web address].    Your next appointment:   1 year(s)  Provider:   Reatha Harps, MD

## 2023-07-09 NOTE — Telephone Encounter (Signed)
Dr O'Neal's patient

## 2023-10-16 NOTE — Progress Notes (Unsigned)
[facility] [address] [place] [address] Phone: [phone] Subjective:   Bruce Donath, am serving as a scribe for Dr. Antoine Primas.  I'm seeing this patient by the request  of:  Pomposini, Rande Brunt, MD  CC: Foot pain  [id number]  09/14/2019 Cervical radiculopathy.  Patient started on gabapentin, prednisone, Toradol and Depo-Medrol given.  X-rays we will monitor.  Follow-up again in 2 to 3 weeks     Updated 10/21/2023 Alantra Gaylord is a 58 y.o. female coming in with complaint of L CMC joint pain, R knee pain and L foot pain over the metatarsal heads. Pain between 2nd and 3rd toes which is worse when she wears flats or sandals. Using ice and NSAIDs.        Past Medical History:  Diagnosis Date   GERD (gastroesophageal reflux disease)    Hyperlipidemia    Migraine    Sleep apnea    CPAP   Urticarial rash 06/02/2014   Past Surgical History:  Procedure Laterality Date   WISDOM TOOTH EXTRACTION     Social History   Socioeconomic History   Marital status: Married    Spouse name: Not on file   Number of children: 1   Years of education: Not on file   Highest education level: Not on file  Occupational History   Occupation: Conservator, museum/gallery  Tobacco Use   Smoking status: Never   Smokeless tobacco: Never  Vaping Use   Vaping status: Never Used  Substance and Sexual Activity   Alcohol use: Yes    Comment: occasionally   Drug use: No   Sexual activity: Not on file  Other Topics Concern   Not on file  Social History Narrative   Not on file   Social Drivers of Health   Financial Resource Strain: Not on file  Food Insecurity: Not on file  Transportation Needs: Not on file  Physical Activity: Not on file  Stress: Not on file  Social Connections: Not on file   Allergies  Allergen Reactions   Codeine Nausea And Vomiting   Egg-Derived Products Other (See Comments)    "Allergy test was positive years ago,  I eat eggs" per pt.   Hydrocodone Nausea And Vomiting   Penicillins Rash   Family History  Problem Relation Age of Onset   Hypertension Mother    Heart disease Mother        BYPASS AND  A ABLATION   Heart attack Mother    Stroke Mother    Colon polyps Mother    Diverticulosis Mother    Heart attack Father    Heart disease Father        OPEN HEART SURGERY/HEART FAILURE/ PACEMAKER AND DEFIB    Stroke Father    Colon polyps Father    Diverticulosis Father    Hiatal hernia Father    Heart disease Sister        HEART ATTACK AGE 65/ HE HAS A STENTX1/ CABG X4 VESSELS AT AGE 36 / AT AGE 6 BRAIN STEM STROKE   Colon polyps Brother    Colon cancer Neg Hx    Esophageal cancer Neg Hx     Current Outpatient Medications (Endocrine & Metabolic):    predniSONE (DELTASONE) 50 MG tablet, 1 tablet by mouth daily  Current Outpatient Medications (Cardiovascular):    atorvastatin (LIPITOR) 20 MG tablet, Take 20 mg by mouth daily.   EPINEPHrine (EPIPEN) 0.3 mg/0.3 mL DEVI, Inject 0.3  mLs (0.3 mg total) into the muscle as needed.   verapamil (CALAN-SR) 180 MG CR tablet, Take 180 mg by mouth at bedtime. For miragrines   metoprolol tartrate (LOPRESSOR) 100 MG tablet, Take 1 tablet (100 mg total) by mouth once for 1 dose. Take 2 hours prior to CCTA  Current Outpatient Medications (Respiratory):    cetirizine (ZYRTEC) 10 MG tablet, Take 10 mg by mouth daily.  Current Outpatient Medications (Analgesics):    acetaminophen (TYLENOL) 500 MG tablet, Take 500 mg by mouth as needed.   aspirin EC 81 MG tablet, Take 81 mg by mouth every morning.   meloxicam (MOBIC) 15 MG tablet, TAKE 1 TABLET BY MOUTH EVERY DAY   SUMAtriptan (IMITREX) 100 MG tablet, Take 100 mg by mouth every 2 (two) hours as needed for migraine.  Current Outpatient Medications (Hematological):    folic acid (FOLVITE) 800 MCG tablet, Take 800 mcg by mouth daily.    vitamin B-12 (CYANOCOBALAMIN) 1000 MCG tablet, Take 1,000 mcg by mouth  daily.  Current Outpatient Medications (Other):    Cholecalciferol (VITAMIN D) 2000 units CAPS, Take 1 capsule by mouth daily.   cyclobenzaprine (FLEXERIL) 5 MG tablet, Take 5 mg by mouth 3 (three) times daily as needed for muscle spasms.   Omega-3 Fatty Acids (FISH OIL DOUBLE STRENGTH) 1200 MG CAPS, Take 1 capsule by mouth 2 (two) times daily. 500 mg   Omeprazole 20 MG TBDD, Take 20 mg by mouth daily.   OVER THE COUNTER MEDICATION, Take 1 capsule by mouth in the morning and at bedtime.   pyridOXINE (B-6) 50 MG tablet, Take 100 mg by mouth daily.   tretinoin (RETIN-A) 0.025 % cream, Apply topically as needed.   venlafaxine (EFFEXOR) 75 MG tablet, Take 75 mg by mouth daily.   venlafaxine XR (EFFEXOR-XR) 37.5 MG 24 hr capsule, Take 1 capsule (37.5 mg total) by mouth daily with breakfast.   Reviewed prior external information including notes and imaging from  primary care provider As well as notes that were available from care everywhere and other healthcare systems.  Past medical history, social, surgical and family history all reviewed in electronic medical record.  No pertanent information unless stated regarding to the chief complaint.   Review of Systems:  No headache, visual changes, nausea, vomiting, diarrhea, constipation, dizziness, abdominal pain, skin rash, fevers, chills, night sweats, weight loss, swollen lymph nodes, body aches, joint swelling, chest pain, shortness of breath, mood changes. POSITIVE muscle aches  Objective  Blood pressure 138/82, pulse 79, height 5\' 4"  (1.626 m), weight 227 lb (103 kg), SpO2 97%.   General: No apparent distress alert and oriented x3 mood and affect normal, dressed appropriately.  HEENT: Pupils equal, extraocular movements intact  Respiratory: Patient's speak in full sentences and does not appear short of breath  Cardiovascular: No lower extremity edema, non tender, no erythema  Foot exam shows patient does have breakdown of the longitudinal  arch minorly but increasing breakdown of the transverse arch.  Mild splaying between the first and second toes.  Negative squeeze test noted.  Limited muscular skeletal ultrasound was performed and interpreted by Antoine Primas, M  Limited ultrasound shows some mild arthritic changes but nothing severe when it comes to the foot.  Regarding patient's left thumb does have some hypoechoic changes consistent with joint effusion as well as does have some narrowing of the joint space.  Ligaments appear to be normal.  Tendons have no significant hypoechoic changes. Impression: CMC arthritis normal foot  [id number];  15 additional minutes spent for Therapeutic exercises as stated in above notes.  This included exercises focusing on stretching, strengthening, with significant focus on eccentric aspects.   Long term goals include an improvement in range of motion, strength, endurance as well as avoiding reinjury. Patient's frequency would include in 1-2 times a day, 3-5 times a week for a duration of 6-12 weeks. Exercises for the foot include:  Stretches to help lengthen the lower leg and plantar fascia areas Theraband exercises for the lower leg and ankle to help strengthen the surrounding area- dorsiflexion, plantarflexion, inversion, eversion Massage rolling on the plantar surface of the foot with a frozen bottle, tennis ball or golf ball Towel or marble pick-ups to strengthen the plantar surface of the foot Weight bearing exercises to increase balance and overall stability   Proper technique shown and discussed handout in great detail with ATC.  All questions were discussed and answered.      Impression and Recommendations:    The above documentation has been reviewed and is accurate and complete Judi Saa, DO

## 2023-10-21 ENCOUNTER — Ambulatory Visit: Payer: BC Managed Care – PPO | Admitting: Family Medicine

## 2023-10-21 ENCOUNTER — Other Ambulatory Visit: Payer: Self-pay

## 2023-10-21 VITALS — BP 138/82 | HR 79 | Ht 64.0 in | Wt 227.0 lb

## 2023-10-21 DIAGNOSIS — M1812 Unilateral primary osteoarthritis of first carpometacarpal joint, left hand: Secondary | ICD-10-CM | POA: Diagnosis not present

## 2023-10-21 DIAGNOSIS — M216X2 Other acquired deformities of left foot: Secondary | ICD-10-CM

## 2023-10-21 DIAGNOSIS — M79671 Pain in right foot: Secondary | ICD-10-CM

## 2023-10-21 DIAGNOSIS — M79672 Pain in left foot: Secondary | ICD-10-CM

## 2023-10-21 NOTE — Patient Instructions (Signed)
Spenco Total Support Altra Olympus shoes Arch exercises Thumb spica splint at night for thumb Keep doing Voltaren and icing B12 Choline 500mg  CoQ10 200mg  See me in 8 weeks

## 2023-10-22 ENCOUNTER — Encounter: Payer: Self-pay | Admitting: Family Medicine

## 2023-10-22 DIAGNOSIS — M1812 Unilateral primary osteoarthritis of first carpometacarpal joint, left hand: Secondary | ICD-10-CM | POA: Insufficient documentation

## 2023-10-22 DIAGNOSIS — M216X2 Other acquired deformities of left foot: Secondary | ICD-10-CM | POA: Insufficient documentation

## 2023-10-22 NOTE — Assessment & Plan Note (Signed)
More of a breakdown of the transverse arch.  Has had difficulty previously and we did discuss the possibility of the fibroma but did not have the pain in the same area.  Seems to be more on the forefoot.  Nothing that is similar to that.  Discussed icing regimen and home exercises.  Over-the-counter orthotics and the potential need for custom orthotics.  Discussed exercises to strengthen some of the arch.  Follow-up with me again in 6 to 8 weeks otherwise.

## 2023-10-22 NOTE — Assessment & Plan Note (Signed)
Mild to moderate.  Will monitor at this time.  Worsening pain consider injection but instead given a brace for night relief icing and topical anti-inflammatories.  Differential is quite broad with some mild potential cervical radiculopathy versus the possibility of a carpal tunnel but not doing as much repetitive activities as she was doing previously.  Will monitor.  Follow-up again in 6 to 8 weeks

## 2023-12-16 NOTE — Progress Notes (Unsigned)
 [facility] [address] [place] [address] Phone: [phone] Subjective:   Bruce Donath, am serving as a scribe for Dr. Antoine Primas.  I'm seeing this patient by the request  of:  Pomposini, Rande Brunt, MD  CC: left foot pain   [id number]  10/21/2023 Mild to moderate.  Will monitor at this time.  Worsening pain consider injection but instead given a brace for night relief icing and topical anti-inflammatories.  Differential is quite broad with some mild potential cervical radiculopathy versus the possibility of a carpal tunnel but not doing as much repetitive activities as she was doing previously.  Will monitor.  Follow-up again in 6 to 8 weeks     More of a breakdown of the transverse arch.  Has had difficulty previously and we did discuss the possibility of the fibroma but did not have the pain in the same area.  Seems to be more on the forefoot.  Nothing that is similar to that.  Discussed icing regimen and home exercises.  Over-the-counter orthotics and the potential need for custom orthotics.  Discussed exercises to strengthen some of the arch.  Follow-up with me again in 6 to 8 weeks otherwise.     Updated 12/17/2023 Lisa Humphrey is a 58 y.o. female coming in with complaint of L foot pain. Patient states that her pain is slightly better.  Patient does have some tightness noted but nothing that is severe.  Has brought in shoes to be looked at.  Trying to be active where possible.  No pain that is stopped her from activity.       Past Medical History:  Diagnosis Date   GERD (gastroesophageal reflux disease)    Hyperlipidemia    Migraine    Sleep apnea    CPAP   Urticarial rash 06/02/2014   Past Surgical History:  Procedure Laterality Date   WISDOM TOOTH EXTRACTION     Social History   Socioeconomic History   Marital status: Married    Spouse name: Not on file   Number of children: 1   Years of education: Not on file    Highest education level: Not on file  Occupational History   Occupation: Conservator, museum/gallery  Tobacco Use   Smoking status: Never   Smokeless tobacco: Never  Vaping Use   Vaping status: Never Used  Substance and Sexual Activity   Alcohol use: Yes    Comment: occasionally   Drug use: No   Sexual activity: Not on file  Other Topics Concern   Not on file  Social History Narrative   Not on file   Social Drivers of Health   Financial Resource Strain: Not on file  Food Insecurity: Not on file  Transportation Needs: Not on file  Physical Activity: Not on file  Stress: Not on file  Social Connections: Not on file   Allergies  Allergen Reactions   Codeine Nausea And Vomiting   Egg-Derived Products Other (See Comments)    "Allergy test was positive years ago, I eat eggs" per pt.   Hydrocodone Nausea And Vomiting   Penicillins Rash   Family History  Problem Relation Age of Onset   Hypertension Mother    Heart disease Mother        BYPASS AND  A ABLATION   Heart attack Mother    Stroke Mother    Colon polyps Mother    Diverticulosis Mother    Heart attack Father  Heart disease Father        OPEN HEART SURGERY/HEART FAILURE/ PACEMAKER AND DEFIB    Stroke Father    Colon polyps Father    Diverticulosis Father    Hiatal hernia Father    Heart disease Sister        HEART ATTACK AGE 73/ HE HAS A STENTX1/ CABG X4 VESSELS AT AGE 45 / AT AGE 51 BRAIN STEM STROKE   Colon polyps Brother    Colon cancer Neg Hx    Esophageal cancer Neg Hx     Current Outpatient Medications (Endocrine & Metabolic):    predniSONE (DELTASONE) 50 MG tablet, 1 tablet by mouth daily  Current Outpatient Medications (Cardiovascular):    atorvastatin (LIPITOR) 20 MG tablet, Take 20 mg by mouth daily.   EPINEPHrine (EPIPEN) 0.3 mg/0.3 mL DEVI, Inject 0.3 mLs (0.3 mg total) into the muscle as needed.   verapamil (CALAN-SR) 180 MG CR tablet, Take 180 mg by mouth at bedtime. For miragrines    metoprolol tartrate (LOPRESSOR) 100 MG tablet, Take 1 tablet (100 mg total) by mouth once for 1 dose. Take 2 hours prior to CCTA  Current Outpatient Medications (Respiratory):    cetirizine (ZYRTEC) 10 MG tablet, Take 10 mg by mouth daily.  Current Outpatient Medications (Analgesics):    acetaminophen (TYLENOL) 500 MG tablet, Take 500 mg by mouth as needed.   aspirin EC 81 MG tablet, Take 81 mg by mouth every morning.   meloxicam (MOBIC) 15 MG tablet, TAKE 1 TABLET BY MOUTH EVERY DAY   SUMAtriptan (IMITREX) 100 MG tablet, Take 100 mg by mouth every 2 (two) hours as needed for migraine.  Current Outpatient Medications (Hematological):    folic acid (FOLVITE) 800 MCG tablet, Take 800 mcg by mouth daily.    vitamin B-12 (CYANOCOBALAMIN) 1000 MCG tablet, Take 1,000 mcg by mouth daily.  Current Outpatient Medications (Other):    Cholecalciferol (VITAMIN D) 2000 units CAPS, Take 1 capsule by mouth daily.   cyclobenzaprine (FLEXERIL) 5 MG tablet, Take 5 mg by mouth 3 (three) times daily as needed for muscle spasms.   Omega-3 Fatty Acids (FISH OIL DOUBLE STRENGTH) 1200 MG CAPS, Take 1 capsule by mouth 2 (two) times daily. 500 mg   Omeprazole 20 MG TBDD, Take 20 mg by mouth daily.   OVER THE COUNTER MEDICATION, Take 1 capsule by mouth in the morning and at bedtime.   pyridOXINE (B-6) 50 MG tablet, Take 100 mg by mouth daily.   tretinoin (RETIN-A) 0.025 % cream, Apply topically as needed.   venlafaxine (EFFEXOR) 75 MG tablet, Take 75 mg by mouth daily.   venlafaxine XR (EFFEXOR-XR) 37.5 MG 24 hr capsule, Take 1 capsule (37.5 mg total) by mouth daily with breakfast.   Reviewed prior external information including notes and imaging from  primary care provider As well as notes that were available from care everywhere and other healthcare systems.  Past medical history, social, surgical and family history all reviewed in electronic medical record.  No pertanent information unless stated regarding to  the chief complaint.   Review of Systems:  No headache, visual changes, nausea, vomiting, diarrhea, constipation, dizziness, abdominal pain, skin rash, fevers, chills, night sweats, weight loss, swollen lymph nodes, body aches, joint swelling, chest pain, shortness of breath, mood changes. POSITIVE muscle aches  Objective  Blood pressure 116/72, pulse 73, height 5\' 4"  (1.626 m), SpO2 98%.   General: No apparent distress alert and oriented x3 mood and affect normal, dressed appropriately.  HEENT: Pupils equal, extraocular movements intact  Respiratory: Patient's speak in full sentences and does not appear short of breath  Cardiovascular: No lower extremity edema, non tender, no erythema  Left foot exam shows patient does have some lateral discomfort of the ankle but nothing severe.  Still breakdown of the transverse ankle more than anything else.  Rigid midfoot noted.     Impression and Recommendations:    The above documentation has been reviewed and is accurate and complete Judi Saa, DO

## 2023-12-17 ENCOUNTER — Ambulatory Visit (INDEPENDENT_AMBULATORY_CARE_PROVIDER_SITE_OTHER): Payer: BC Managed Care – PPO | Admitting: Family Medicine

## 2023-12-17 ENCOUNTER — Other Ambulatory Visit: Payer: Self-pay

## 2023-12-17 VITALS — BP 116/72 | HR 73 | Ht 64.0 in

## 2023-12-17 DIAGNOSIS — M216X2 Other acquired deformities of left foot: Secondary | ICD-10-CM | POA: Diagnosis not present

## 2023-12-17 DIAGNOSIS — M79672 Pain in left foot: Secondary | ICD-10-CM

## 2023-12-17 NOTE — Patient Instructions (Signed)
 Ferrous gluconate 65mg  w 500mg  of Vit C Magnesium oxide 400mg  Allie Bossier or Marcene Corning I like the shoes See me in 3 months

## 2023-12-17 NOTE — Assessment & Plan Note (Addendum)
 Discussed with patient again at great length.  We discussed her home exercises, discussed the proper orthotics, discussed proper shoes.  Continuing to do the exercises.  The importance of weight loss.  Low impact exercises, no change in medication at the moment.  Follow-up again in 12weeks otherwise.  Total time with patient today though 32 minutes

## 2024-03-16 NOTE — Progress Notes (Signed)
 [facility] [address] [place] [address] Phone: [phone] Subjective:   Lisa Humphrey, am serving as a scribe for Dr. Arthea Humphrey.  I'Lisa seeing this patient by the request  of:  Lisa Humphrey, Lisa CROME, MD  CC: foot Pain follow-up  [id number]  12/17/2023 Discussed with patient again at great length.  We discussed her home exercises, discussed the proper orthotics, discussed proper shoes.  Continuing to do the exercises.  The importance of weight loss.  Low impact exercises, no change in medication at the moment.  Follow-up again in 12weeks otherwise.  Total time with patient today though 32 minutes   Update 03/17/2024 Lisa Humphrey is a 58 y.o. female coming in with complaint of L foot pain. Patient states doing better. Orthotics are working.  No new symptoms.     Past Medical History:  Diagnosis Date   GERD (gastroesophageal reflux disease)    Hyperlipidemia    Migraine    Sleep apnea    CPAP   Urticarial rash 06/02/2014   Past Surgical History:  Procedure Laterality Date   WISDOM TOOTH EXTRACTION     Social History   Socioeconomic History   Marital status: Married    Spouse name: Not on file   Number of children: 1   Years of education: Not on file   Highest education level: Not on file  Occupational History   Occupation: Conservator, museum/gallery  Tobacco Use   Smoking status: Never   Smokeless tobacco: Never  Vaping Use   Vaping status: Never Used  Substance and Sexual Activity   Alcohol use: Yes    Comment: occasionally   Drug use: No   Sexual activity: Not on file  Other Topics Concern   Not on file  Social History Narrative   Not on file   Social Drivers of Health   Financial Resource Strain: Not on file  Food Insecurity: Not on file  Transportation Needs: Not on file  Physical Activity: Not on file  Stress: Not on file  Social Connections: Not on file   Allergies  Allergen Reactions   Codeine  Nausea And Vomiting   Egg-Derived Products Other (See Comments)    Allergy test was positive years ago, I eat eggs per pt.   Hydrocodone Nausea And Vomiting   Penicillins Rash   Family History  Problem Relation Age of Onset   Hypertension Mother    Heart disease Mother        BYPASS AND  A ABLATION   Heart attack Mother    Stroke Mother    Colon polyps Mother    Diverticulosis Mother    Heart attack Father    Heart disease Father        OPEN HEART SURGERY/HEART FAILURE/ PACEMAKER AND DEFIB    Stroke Father    Colon polyps Father    Diverticulosis Father    Hiatal hernia Father    Heart disease Sister        HEART ATTACK AGE 51/ HE HAS A STENTX1/ CABG X4 VESSELS AT AGE 57 / AT AGE 74 BRAIN STEM STROKE   Colon polyps Brother    Colon cancer Neg Hx    Esophageal cancer Neg Hx     Current Outpatient Medications (Endocrine & Metabolic):    predniSONE  (DELTASONE ) 50 MG tablet, 1 tablet by mouth daily  Current Outpatient Medications (Cardiovascular):    atorvastatin (LIPITOR) 20 MG tablet, Take 20 mg by mouth daily.  EPINEPHrine  (EPIPEN ) 0.3 mg/0.3 mL DEVI, Inject 0.3 mLs (0.3 mg total) into the muscle as needed.   metoprolol  tartrate (LOPRESSOR ) 100 MG tablet, Take 1 tablet (100 mg total) by mouth once for 1 dose. Take 2 hours prior to CCTA   verapamil (CALAN-SR) 180 MG CR tablet, Take 180 mg by mouth at bedtime. For miragrines  Current Outpatient Medications (Respiratory):    cetirizine (ZYRTEC) 10 MG tablet, Take 10 mg by mouth daily.  Current Outpatient Medications (Analgesics):    acetaminophen (TYLENOL) 500 MG tablet, Take 500 mg by mouth as needed.   aspirin EC 81 MG tablet, Take 81 mg by mouth every morning.   meloxicam  (MOBIC ) 15 MG tablet, TAKE 1 TABLET BY MOUTH EVERY DAY   SUMAtriptan (IMITREX) 100 MG tablet, Take 100 mg by mouth every 2 (two) hours as needed for migraine.  Current Outpatient Medications (Hematological):    folic acid (FOLVITE) 800 MCG tablet,  Take 800 mcg by mouth daily.    vitamin B-12 (CYANOCOBALAMIN) 1000 MCG tablet, Take 1,000 mcg by mouth daily.  Current Outpatient Medications (Other):    Cholecalciferol (VITAMIN D ) 2000 units CAPS, Take 1 capsule by mouth daily.   cyclobenzaprine (FLEXERIL) 5 MG tablet, Take 5 mg by mouth 3 (three) times daily as needed for muscle spasms.   Omega-3 Fatty Acids (FISH OIL DOUBLE STRENGTH) 1200 MG CAPS, Take 1 capsule by mouth 2 (two) times daily. 500 mg   Omeprazole 20 MG TBDD, Take 20 mg by mouth daily.   OVER THE COUNTER MEDICATION, Take 1 capsule by mouth in the morning and at bedtime.   pyridOXINE (B-6) 50 MG tablet, Take 100 mg by mouth daily.   tretinoin (RETIN-A) 0.025 % cream, Apply topically as needed.   venlafaxine  (EFFEXOR ) 75 MG tablet, Take 75 mg by mouth daily.   venlafaxine  XR (EFFEXOR -XR) 37.5 MG 24 hr capsule, Take 1 capsule (37.5 mg total) by mouth daily with breakfast.    Objective  Blood pressure 122/78, pulse 87, height 5' 4 (1.626 Lisa), weight 233 lb (105.7 kg), SpO2 96%.   General: No apparent distress alert and oriented x3 mood and affect normal, dressed appropriately.  HEENT: Pupils equal, extraocular movements intact  Respiratory: Patient's speak in full sentences and does not appear short of breath  Cardiovascular: No lower extremity edema, non tender, no erythema   Foot exam shows deferred evaluation with patient able to ambulate without any significant pain.   Impression and Recommendations:    The above documentation has been reviewed and is accurate and complete Lisa Humphrey Lisa Ronan Dion, DO

## 2024-03-17 ENCOUNTER — Ambulatory Visit (INDEPENDENT_AMBULATORY_CARE_PROVIDER_SITE_OTHER): Admitting: Family Medicine

## 2024-03-17 VITALS — BP 122/78 | HR 87 | Ht 64.0 in | Wt 233.0 lb

## 2024-03-17 DIAGNOSIS — M216X2 Other acquired deformities of left foot: Secondary | ICD-10-CM

## 2024-03-17 NOTE — Patient Instructions (Signed)
 Good to see you! Dr. Dalldorf or Dr. Lucienne Ryder for husband See me when you need me

## 2024-03-17 NOTE — Assessment & Plan Note (Signed)
 Significant improvement at this time.  No other changes in management will follow-up as needed
